# Patient Record
Sex: Male | Born: 1955 | Race: White | Hispanic: No | Smoking: Former smoker
Health system: Southern US, Community
[De-identification: ages and names within clinical notes are randomized; demographics above are authoritative.]

## PROBLEM LIST (undated history)

## (undated) DIAGNOSIS — K219 Gastro-esophageal reflux disease without esophagitis: Secondary | ICD-10-CM

## (undated) DIAGNOSIS — A630 Anogenital (venereal) warts: Secondary | ICD-10-CM

## (undated) DIAGNOSIS — N189 Chronic kidney disease, unspecified: Secondary | ICD-10-CM

## (undated) DIAGNOSIS — M1990 Unspecified osteoarthritis, unspecified site: Secondary | ICD-10-CM

## (undated) DIAGNOSIS — R011 Cardiac murmur, unspecified: Secondary | ICD-10-CM

## (undated) HISTORY — DX: Cardiac murmur, unspecified: R01.1

## (undated) HISTORY — DX: Gastro-esophageal reflux disease without esophagitis: K21.9

## (undated) HISTORY — DX: Chronic kidney disease, unspecified: N18.9

## (undated) HISTORY — DX: Unspecified osteoarthritis, unspecified site: M19.90

## (undated) HISTORY — DX: Anogenital (venereal) warts: A63.0

## (undated) HISTORY — PX: HERNIA REPAIR: SHX51

---

## 2003-05-12 ENCOUNTER — Encounter: Payer: Self-pay | Admitting: Family Medicine

## 2004-09-15 ENCOUNTER — Ambulatory Visit: Payer: Self-pay | Admitting: Psychology

## 2004-09-29 ENCOUNTER — Ambulatory Visit: Payer: Self-pay | Admitting: Psychology

## 2004-10-03 ENCOUNTER — Ambulatory Visit: Payer: Self-pay | Admitting: Psychology

## 2004-10-15 ENCOUNTER — Ambulatory Visit: Payer: Self-pay | Admitting: Psychology

## 2004-10-27 ENCOUNTER — Ambulatory Visit: Payer: Self-pay | Admitting: Psychology

## 2004-11-10 ENCOUNTER — Ambulatory Visit: Payer: Self-pay | Admitting: Psychology

## 2004-11-24 ENCOUNTER — Ambulatory Visit: Payer: Self-pay | Admitting: Psychology

## 2004-12-22 ENCOUNTER — Ambulatory Visit: Payer: Self-pay | Admitting: Psychology

## 2005-01-05 ENCOUNTER — Ambulatory Visit: Payer: Self-pay | Admitting: Psychology

## 2005-01-19 ENCOUNTER — Ambulatory Visit: Payer: Self-pay | Admitting: Psychology

## 2005-02-18 ENCOUNTER — Ambulatory Visit: Payer: Self-pay | Admitting: Psychology

## 2005-03-11 ENCOUNTER — Ambulatory Visit: Payer: Self-pay | Admitting: Psychology

## 2005-03-30 ENCOUNTER — Ambulatory Visit: Payer: Self-pay | Admitting: Psychology

## 2005-04-27 HISTORY — PX: KNEE SURGERY: SHX244

## 2008-12-06 ENCOUNTER — Ambulatory Visit: Payer: Self-pay | Admitting: Family Medicine

## 2008-12-07 LAB — CONVERTED CEMR LAB
ALT: 23 units/L (ref 0–53)
AST: 21 units/L (ref 0–37)
BUN: 23 mg/dL (ref 6–23)
Basophils Absolute: 0 10*3/uL (ref 0.0–0.1)
Bilirubin, Direct: 0.1 mg/dL (ref 0.0–0.3)
Calcium: 9.5 mg/dL (ref 8.4–10.5)
Cholesterol: 191 mg/dL (ref 0–200)
Creatinine, Ser: 0.9 mg/dL (ref 0.4–1.5)
Eosinophils Relative: 0.7 % (ref 0.0–5.0)
GFR calc non Af Amer: 93.73 mL/min (ref >60)
HDL: 42.3 mg/dL (ref >39.00)
LDL Cholesterol: 133 mg/dL — ABNORMAL HIGH (ref 0–99)
Monocytes Relative: 9.8 % (ref 3.0–12.0)
Neutrophils Relative %: 53.7 % (ref 43.0–77.0)
PSA: 0.41 ng/mL (ref 0.10–4.00)
Platelets: 153 10*3/uL (ref 150.0–400.0)
Total Bilirubin: 0.9 mg/dL (ref 0.3–1.2)
Triglycerides: 81 mg/dL (ref 0.0–149.0)
VLDL: 16.2 mg/dL (ref 0.0–40.0)
WBC: 5.6 10*3/uL (ref 4.5–10.5)

## 2009-01-01 ENCOUNTER — Ambulatory Visit: Payer: Self-pay | Admitting: Gastroenterology

## 2009-01-14 ENCOUNTER — Ambulatory Visit: Payer: Self-pay | Admitting: Gastroenterology

## 2009-01-23 ENCOUNTER — Telehealth: Payer: Self-pay | Admitting: Family Medicine

## 2009-01-23 DIAGNOSIS — N509 Disorder of male genital organs, unspecified: Secondary | ICD-10-CM | POA: Insufficient documentation

## 2009-02-12 ENCOUNTER — Encounter: Payer: Self-pay | Admitting: Family Medicine

## 2009-04-27 HISTORY — PX: MOUTH SURGERY: SHX715

## 2013-05-16 ENCOUNTER — Telehealth: Payer: Self-pay | Admitting: Family Medicine

## 2013-05-16 NOTE — Telephone Encounter (Signed)
Pt was last seen in 2010. Pt would like to re-est. Can I sch?

## 2013-05-17 NOTE — Telephone Encounter (Signed)
yes

## 2013-05-18 NOTE — Telephone Encounter (Signed)
PT HAS BEEN SCH

## 2013-05-18 NOTE — Telephone Encounter (Signed)
lmom for pt to call back

## 2013-05-24 ENCOUNTER — Encounter: Payer: Self-pay | Admitting: Family Medicine

## 2013-05-24 ENCOUNTER — Ambulatory Visit (INDEPENDENT_AMBULATORY_CARE_PROVIDER_SITE_OTHER): Payer: 59 | Admitting: Family Medicine

## 2013-05-24 VITALS — BP 130/70 | HR 88 | Temp 98.5°F | Ht 71.0 in | Wt 187.0 lb

## 2013-05-24 DIAGNOSIS — K219 Gastro-esophageal reflux disease without esophagitis: Secondary | ICD-10-CM | POA: Insufficient documentation

## 2013-05-24 DIAGNOSIS — Z0000 Encounter for general adult medical examination without abnormal findings: Secondary | ICD-10-CM

## 2013-05-24 LAB — CBC WITH DIFFERENTIAL/PLATELET
BASOS PCT: 0.5 % (ref 0.0–3.0)
Basophils Absolute: 0 10*3/uL (ref 0.0–0.1)
EOS ABS: 0 10*3/uL (ref 0.0–0.7)
Eosinophils Relative: 0.6 % (ref 0.0–5.0)
HCT: 41.7 % (ref 39.0–52.0)
HEMOGLOBIN: 13.8 g/dL (ref 13.0–17.0)
Lymphocytes Relative: 31.3 % (ref 12.0–46.0)
Lymphs Abs: 1.8 10*3/uL (ref 0.7–4.0)
MCHC: 33.2 g/dL (ref 30.0–36.0)
MCV: 94.4 fl (ref 78.0–100.0)
MONO ABS: 0.5 10*3/uL (ref 0.1–1.0)
Monocytes Relative: 8 % (ref 3.0–12.0)
NEUTROS ABS: 3.5 10*3/uL (ref 1.4–7.7)
NEUTROS PCT: 59.6 % (ref 43.0–77.0)
Platelets: 186 10*3/uL (ref 150.0–400.0)
RBC: 4.42 Mil/uL (ref 4.22–5.81)
RDW: 12.8 % (ref 11.5–14.6)
WBC: 5.9 10*3/uL (ref 4.5–10.5)

## 2013-05-24 LAB — HEPATIC FUNCTION PANEL
ALBUMIN: 4.2 g/dL (ref 3.5–5.2)
ALT: 28 U/L (ref 0–53)
AST: 21 U/L (ref 0–37)
Alkaline Phosphatase: 51 U/L (ref 39–117)
Bilirubin, Direct: 0 mg/dL (ref 0.0–0.3)
TOTAL PROTEIN: 6.6 g/dL (ref 6.0–8.3)
Total Bilirubin: 0.6 mg/dL (ref 0.3–1.2)

## 2013-05-24 LAB — BASIC METABOLIC PANEL
BUN: 18 mg/dL (ref 6–23)
CALCIUM: 9.5 mg/dL (ref 8.4–10.5)
CO2: 29 mEq/L (ref 19–32)
Chloride: 104 mEq/L (ref 96–112)
Creatinine, Ser: 0.9 mg/dL (ref 0.4–1.5)
GFR: 91.04 mL/min (ref >60.00)
GLUCOSE: 90 mg/dL (ref 70–99)
Potassium: 4 mEq/L (ref 3.5–5.1)
SODIUM: 139 meq/L (ref 135–145)

## 2013-05-24 LAB — LIPID PANEL
CHOLESTEROL: 224 mg/dL — AB (ref 0–200)
HDL: 45 mg/dL (ref >39.00)
Total CHOL/HDL Ratio: 5
Triglycerides: 125 mg/dL (ref 0.0–149.0)
VLDL: 25 mg/dL (ref 0.0–40.0)

## 2013-05-24 LAB — TSH: TSH: 2.51 u[IU]/mL (ref 0.35–5.50)

## 2013-05-24 LAB — PSA: PSA: 0.38 ng/mL (ref 0.10–4.00)

## 2013-05-24 NOTE — Progress Notes (Signed)
Pre visit review using our clinic review tool, if applicable. No additional management support is needed unless otherwise documented below in the visit note. 

## 2013-05-24 NOTE — Progress Notes (Signed)
   Subjective:    Patient ID: Jose Tyler, male    DOB: 01-17-1956, 58 y.o.   MRN: [id number]  HPI Patient here to establish care and for complete physical. Generally very healthy. He takes no regular medications. He states he had some type of" nephritis"at age 73 with no sequelae. He states he has past history of general warts. He had some recent GERD symptoms currently not taking in medications for that. He's had previous tonsillectomy. He had arthroscopic knee surgery 2007.  Patient is single. He underwent divorce several years ago. Has used Motrin remotely in the past. Occasional alcohol use. Currently not exercising regularly. Had colonoscopy age 57. Tetanus up-to-date. Declined flu vaccine.  Past Medical History  Diagnosis Date  . GERD (gastroesophageal reflux disease)   . Genital warts   . Heart murmur     Childhood  . Chronic kidney disease     age 72   Past Surgical History  Procedure Laterality Date  . Appendectomy    . Knee surgery Right 2007  . Mouth surgery  2011    reports that he has quit smoking. He does not have any smokeless tobacco history on file. He reports that he drinks alcohol. He reports that he does not use illicit drugs. family history includes Arthritis in his mother; Cancer in his paternal grandfather; Depression in his mother and sister; Hyperlipidemia in his father; Hypertension in his mother and paternal grandmother. No Known Allergies    Review of Systems  Constitutional: Negative for fever, activity change, appetite change and fatigue.  HENT: Negative for congestion, ear pain and trouble swallowing.   Eyes: Negative for pain and visual disturbance.  Respiratory: Negative for cough, shortness of breath and wheezing.   Cardiovascular: Negative for chest pain and palpitations.  Gastrointestinal: Negative for nausea, vomiting, abdominal pain, diarrhea, constipation, blood in stool, abdominal distention and rectal pain.  Genitourinary: Negative for  dysuria, hematuria and testicular pain.  Musculoskeletal: Negative for arthralgias and joint swelling.  Skin: Negative for rash.  Neurological: Negative for dizziness, syncope and headaches.  Hematological: Negative for adenopathy.  Psychiatric/Behavioral: Negative for confusion and dysphoric mood.       Objective:   Physical Exam  Constitutional: He is oriented to person, place, and time. He appears well-developed and well-nourished. No distress.  HENT:  Head: Normocephalic and atraumatic.  Right Ear: External ear normal.  Left Ear: External ear normal.  Mouth/Throat: Oropharynx is clear and moist.  Eyes: Conjunctivae and EOM are normal. Pupils are equal, round, and reactive to light.  Neck: Normal range of motion. Neck supple. No thyromegaly present.  Cardiovascular: Normal rate, regular rhythm and normal heart sounds.   No murmur heard. Pulmonary/Chest: No respiratory distress. He has no wheezes. He has no rales.  Abdominal: Soft. Bowel sounds are normal. He exhibits no distension and no mass. There is no tenderness. There is no rebound and no guarding.  Genitourinary: Rectum normal and prostate normal.  Musculoskeletal: He exhibits no edema.  Lymphadenopathy:    He has no cervical adenopathy.  Neurological: He is alert and oriented to person, place, and time. He displays normal reflexes. No cranial nerve deficit.  Skin: No rash noted.  Psychiatric: He has a normal mood and affect.          Assessment & Plan:  Complete physical. Labs obtained.  Discussed GERD management. Try over-the-counter Zantac or Pepcid for symptomatic treatment. Establish more consistent exercise. Patient will confirm date of last colonoscopy

## 2013-05-24 NOTE — Patient Instructions (Signed)
Gastroesophageal Reflux Disease, Adult  Gastroesophageal reflux disease (GERD) happens when acid from your stomach flows up into the esophagus. When acid comes in contact with the esophagus, the acid causes soreness (inflammation) in the esophagus. Over time, GERD may create small holes (ulcers) in the lining of the esophagus.  CAUSES   · Increased body weight. This puts pressure on the stomach, making acid rise from the stomach into the esophagus.  · Smoking. This increases acid production in the stomach.  · Drinking alcohol. This causes decreased pressure in the lower esophageal sphincter (valve or ring of muscle between the esophagus and stomach), allowing acid from the stomach into the esophagus.  · Late evening meals and a full stomach. This increases pressure and acid production in the stomach.  · A malformed lower esophageal sphincter.  Sometimes, no cause is found.  SYMPTOMS   · Burning pain in the lower part of the mid-chest behind the breastbone and in the mid-stomach area. This may occur twice a week or more often.  · Trouble swallowing.  · Sore throat.  · Dry cough.  · Asthma-like symptoms including chest tightness, shortness of breath, or wheezing.  DIAGNOSIS   Your caregiver may be able to diagnose GERD based on your symptoms. In some cases, X-rays and other tests may be done to check for complications or to check the condition of your stomach and esophagus.  TREATMENT   Your caregiver may recommend over-the-counter or prescription medicines to help decrease acid production. Ask your caregiver before starting or adding any new medicines.   HOME CARE INSTRUCTIONS   · Change the factors that you can control. Ask your caregiver for guidance concerning weight loss, quitting smoking, and alcohol consumption.  · Avoid foods and drinks that make your symptoms worse, such as:  · Caffeine or alcoholic drinks.  · Chocolate.  · Peppermint or mint flavorings.  · Garlic and onions.  · Spicy foods.  · Citrus fruits,  such as oranges, lemons, or limes.  · Tomato-based foods such as sauce, chili, salsa, and pizza.  · Fried and fatty foods.  · Avoid lying down for the 3 hours prior to your bedtime or prior to taking a nap.  · Eat small, frequent meals instead of large meals.  · Wear loose-fitting clothing. Do not wear anything tight around your waist that causes pressure on your stomach.  · Raise the head of your bed 6 to 8 inches with wood blocks to help you sleep. Extra pillows will not help.  · Only take over-the-counter or prescription medicines for pain, discomfort, or fever as directed by your caregiver.  · Do not take aspirin, ibuprofen, or other nonsteroidal anti-inflammatory drugs (NSAIDs).  SEEK IMMEDIATE MEDICAL CARE IF:   · You have pain in your arms, neck, jaw, teeth, or back.  · Your pain increases or changes in intensity or duration.  · You develop nausea, vomiting, or sweating (diaphoresis).  · You develop shortness of breath, or you faint.  · Your vomit is green, yellow, black, or looks like coffee grounds or blood.  · Your stool is red, bloody, or black.  These symptoms could be signs of other problems, such as heart disease, gastric bleeding, or esophageal bleeding.  MAKE SURE YOU:   · Understand these instructions.  · Will watch your condition.  · Will get help right away if you are not doing well or get worse.  Document Released: 01/21/2005 Document Revised: 07/06/2011 Document Reviewed: 10/31/2010  [employer]® Patient   Information ©2014 [employer].

## 2013-05-25 LAB — LDL CHOLESTEROL, DIRECT: LDL DIRECT: 152.4 mg/dL

## 2013-10-18 ENCOUNTER — Encounter (HOSPITAL_COMMUNITY): Payer: Self-pay | Admitting: Emergency Medicine

## 2013-10-18 DIAGNOSIS — N189 Chronic kidney disease, unspecified: Secondary | ICD-10-CM | POA: Insufficient documentation

## 2013-10-18 DIAGNOSIS — Y929 Unspecified place or not applicable: Secondary | ICD-10-CM | POA: Insufficient documentation

## 2013-10-18 DIAGNOSIS — T17208A Unspecified foreign body in pharynx causing other injury, initial encounter: Secondary | ICD-10-CM | POA: Insufficient documentation

## 2013-10-18 DIAGNOSIS — Y9389 Activity, other specified: Secondary | ICD-10-CM | POA: Insufficient documentation

## 2013-10-18 DIAGNOSIS — IMO0002 Reserved for concepts with insufficient information to code with codable children: Secondary | ICD-10-CM | POA: Insufficient documentation

## 2013-10-18 DIAGNOSIS — R011 Cardiac murmur, unspecified: Secondary | ICD-10-CM | POA: Insufficient documentation

## 2013-10-18 DIAGNOSIS — Z87891 Personal history of nicotine dependence: Secondary | ICD-10-CM | POA: Insufficient documentation

## 2013-10-18 DIAGNOSIS — Z8619 Personal history of other infectious and parasitic diseases: Secondary | ICD-10-CM | POA: Insufficient documentation

## 2013-10-18 DIAGNOSIS — Z8719 Personal history of other diseases of the digestive system: Secondary | ICD-10-CM | POA: Insufficient documentation

## 2013-10-18 DIAGNOSIS — Z9889 Other specified postprocedural states: Secondary | ICD-10-CM | POA: Insufficient documentation

## 2013-10-18 NOTE — ED Notes (Signed)
Pt. reports food stuck at throat while eating chicken this evening , airway intact/ respirations unlabored .

## 2013-10-19 ENCOUNTER — Emergency Department (HOSPITAL_COMMUNITY)
Admission: EM | Admit: 2013-10-19 | Discharge: 2013-10-19 | Disposition: A | Discharge status: Home / Self Care | Payer: 59 | Attending: Emergency Medicine | Admitting: Emergency Medicine

## 2013-10-19 DIAGNOSIS — T17298A Other foreign object in pharynx causing other injury, initial encounter: Secondary | ICD-10-CM

## 2013-10-19 MED ORDER — GI COCKTAIL ~~LOC~~
30.0000 mL | Freq: Once | ORAL | Status: AC
  Administered 2013-10-19: 30 mL via ORAL
  Filled 2013-10-19: qty 30

## 2013-10-19 NOTE — Discharge Instructions (Signed)
You will have some soreness to the back of your throat for the next few days.  Stick to a soft diet.  Salt water gargles will help with improved healing.  Return to the ER for worsening condition or new concerning symptoms.

## 2013-10-19 NOTE — ED Provider Notes (Signed)
CSN: [id number]     Arrival date & time 10/18/13  2214 History   First MD Initiated Contact with Patient 10/19/13 0253     Chief Complaint  Patient presents with  . Sore Throat     (Consider location/radiation/quality/duration/timing/severity/associated sxs/prior Treatment) HPI 58 year old male presents to emergency room with complaint of foreign object lodged in the back of his throat.  Patient reports that he was eating chicken when he suddenly felt a sharp sticking sensation.  Since that time, he has had difficulty swallowing.  Patient reports that he can see the object.  He is unsure if it is a chicken bone or a feather. Past Medical History  Diagnosis Date  . GERD (gastroesophageal reflux disease)   . Genital warts   . Heart murmur     Childhood  . Chronic kidney disease     age 72   Past Surgical History  Procedure Laterality Date  . Appendectomy    . Knee surgery Right 2007  . Mouth surgery  2011   Family History  Problem Relation Age of Onset  . Depression Mother   . Arthritis Mother   . Hypertension Mother   . Hyperlipidemia Father   . Hypertension Paternal Grandmother   . Depression Sister   . Cancer Paternal Grandfather     esophageal   History  Substance Use Topics  . Smoking status: Former Research scientist (life sciences)  . Smokeless tobacco: Not on file  . Alcohol Use: Yes    Review of Systems  See History of Present Illness; otherwise all other systems are reviewed and negative   Allergies  Review of patient's allergies indicates no known allergies.  Home Medications   Prior to Admission medications   Not on File   BP 135/87  Pulse 74  Temp(Src) 98.1 F (36.7 C) (Oral)  Resp 20  Ht 6' (1.829 m)  Wt 174 lb (78.926 kg)  BMI 23.59 kg/m2  SpO2 100% Physical Exam  Nursing note and vitals reviewed. Constitutional: He is oriented to person, place, and time. He appears well-developed and well-nourished. He appears distressed.  HENT:  Head: Normocephalic and  atraumatic.  Right Ear: External ear normal.  Left Ear: External ear normal.  Nose: Nose normal.  Mouth/Throat: Oropharynx is clear and moist.   Foreign object noted behind right tonsillar pillar in posterior throat  Cardiovascular: Normal rate, regular rhythm, normal heart sounds and intact distal pulses.  Exam reveals no gallop and no friction rub.   No murmur heard. Pulmonary/Chest: Effort normal and breath sounds normal. No respiratory distress. He has no wheezes. He has no rales. He exhibits no tenderness.  Musculoskeletal: Normal range of motion. He exhibits no edema and no tenderness.  Neurological: He is alert and oriented to person, place, and time. He exhibits normal muscle tone. Coordination normal.  Skin: Skin is warm and dry. No rash noted. He is not diaphoretic. No erythema. No pallor.  Psychiatric: He has a normal mood and affect. His behavior is normal. Judgment and thought content normal.    ED Course  FOREIGN BODY REMOVAL Date/Time: 10/19/2013 3:15 AM Performed by: Kalman Drape Authorized by: Kalman Drape Consent: Verbal consent obtained. Consent given by: patient Patient understanding: patient states understanding of the procedure being performed Required items: required blood products, implants, devices, and special equipment available Patient identity confirmed: verbally with patient Body area: throat Local anesthetic: topical anesthetic Patient sedated: no Patient restrained: no Patient cooperative: no Localization method: visualized Removal mechanism: alligator forceps Complexity:  simple 1 objects recovered. Objects recovered: 1 metal bristle Post-procedure assessment: foreign body removed Patient tolerance: Patient tolerated the procedure well with no immediate complications.   (including critical care time) Labs Review Labs Reviewed - No data to display  Imaging Review No results found.   EKG Interpretation None      MDM   Final  diagnoses:  Oth foreign object in pharynx causing oth injury, init   58 year old male with foreign object in throat.  Object appears to be the bristle from a grill cleaning brush.  Object was removed.  Patient had immediate relief.  Patient instructed to do saltwater gargles and stick to a soft diet until feeling better.    Kalman Drape, MD 10/19/13 [phone]

## 2013-10-19 NOTE — ED Notes (Signed)
MD at bedside. 

## 2014-04-14 ENCOUNTER — Encounter (HOSPITAL_COMMUNITY): Payer: Self-pay | Admitting: Emergency Medicine

## 2014-04-14 ENCOUNTER — Emergency Department (INDEPENDENT_AMBULATORY_CARE_PROVIDER_SITE_OTHER)
Admission: EM | Admit: 2014-04-14 | Discharge: 2014-04-14 | Disposition: A | Discharge status: Home / Self Care | Payer: PRIVATE HEALTH INSURANCE | Source: Home / Self Care | Attending: Emergency Medicine | Admitting: Emergency Medicine

## 2014-04-14 DIAGNOSIS — J069 Acute upper respiratory infection, unspecified: Secondary | ICD-10-CM

## 2014-04-14 LAB — POCT RAPID STREP A: Streptococcus, Group A Screen (Direct): NEGATIVE

## 2014-04-14 MED ORDER — IPRATROPIUM BROMIDE 0.06 % NA SOLN: 2.0000 | Freq: Four times a day (QID) | NASAL | Status: DC

## 2014-04-14 MED ORDER — BENZONATATE 100 MG PO CAPS: 100.0000 mg | ORAL_CAPSULE | Freq: Three times a day (TID) | ORAL | Status: DC | PRN

## 2014-04-14 NOTE — ED Provider Notes (Signed)
CSN: [id number]     Arrival date & time 04/14/14  2130 History   First MD Initiated Contact with Patient 04/14/14 [phone]     Chief Complaint  Patient presents with  . URI   (Consider location/radiation/quality/duration/timing/severity/associated sxs/prior Treatment) Patient is a 58 y.o. male presenting with URI. The history is provided by the patient.  URI Presenting symptoms: congestion, cough, fatigue, rhinorrhea and sore throat   Presenting symptoms: no ear pain, no facial pain and no fever   Severity:  Moderate Onset quality:  Gradual Duration:  6 days Timing:  Constant Progression:  Unchanged Chronicity:  New Associated symptoms: myalgias   Associated symptoms: no arthralgias, no headaches, no neck pain, no sinus pain, no sneezing, no swollen glands and no wheezing     Past Medical History  Diagnosis Date  . GERD (gastroesophageal reflux disease)   . Genital warts   . Heart murmur     Childhood  . Chronic kidney disease     age 61   Past Surgical History  Procedure Laterality Date  . Appendectomy    . Knee surgery Right 2007  . Mouth surgery  2011   Family History  Problem Relation Age of Onset  . Depression Mother   . Arthritis Mother   . Hypertension Mother   . Hyperlipidemia Father   . Hypertension Paternal Grandmother   . Depression Sister   . Cancer Paternal Grandfather     esophageal   History  Substance Use Topics  . Smoking status: Former Research scientist (life sciences)  . Smokeless tobacco: Not on file  . Alcohol Use: Yes    Review of Systems  Constitutional: Positive for fatigue. Negative for fever.  HENT: Positive for congestion, rhinorrhea and sore throat. Negative for ear pain and sneezing.   Respiratory: Positive for cough. Negative for wheezing.   Musculoskeletal: Positive for myalgias. Negative for arthralgias and neck pain.  Neurological: Negative for headaches.  All other systems reviewed and are negative.   Allergies  Review of patient's allergies  indicates no known allergies.  Home Medications   Prior to Admission medications   Medication Sig Start Date End Date Taking? Authorizing Provider  benzonatate (TESSALON) 100 MG capsule Take 1 capsule (100 mg total) by mouth 3 (three) times daily as needed for cough. 04/14/14   Audelia Hives Albaraa Swingle, PA  ipratropium (ATROVENT) 0.06 % nasal spray Place 2 sprays into both nostrils 4 (four) times daily. 04/14/14   Annett Gula H Mikai Meints, PA   BP 128/78 mmHg  Pulse 84  Temp(Src) 98.5 F (36.9 C) (Oral)  Resp 16  SpO2 99% Physical Exam  Constitutional: He is oriented to person, place, and time. He appears well-developed and well-nourished. No distress.  HENT:  Head: Normocephalic and atraumatic.  Right Ear: Hearing, tympanic membrane, external ear and ear canal normal.  Left Ear: Hearing, tympanic membrane, external ear and ear canal normal.  Nose: Nose normal.  Mouth/Throat: Uvula is midline and mucous membranes are normal. No oral lesions. No trismus in the jaw. No uvula swelling. Posterior oropharyngeal erythema present. No oropharyngeal exudate, posterior oropharyngeal edema or tonsillar abscesses.  Eyes: Conjunctivae are normal.  Neck: Normal range of motion. Neck supple.  Cardiovascular: Normal rate, regular rhythm and normal heart sounds.   Pulmonary/Chest: Effort normal and breath sounds normal.  Musculoskeletal: Normal range of motion.  Lymphadenopathy:    He has no cervical adenopathy.  Neurological: He is alert and oriented to person, place, and time.  Skin: Skin is warm and  dry.  Psychiatric: He has a normal mood and affect. His behavior is normal.  Nursing note and vitals reviewed.   ED Course  Procedures (including critical care time) Labs Review Labs Reviewed  POCT RAPID STREP A ([facility] CARE ONLY)    Imaging Review No results found.   MDM   1. URI (upper respiratory infection)    Rapid strep negative Exam benign Symptomatic care at home Atrovent nasal  spray for congestion Tessalon for cough PCP follow up if no improvement.   Lutricia Feil, [place] 04/14/14 1013

## 2014-04-14 NOTE — Discharge Instructions (Signed)
Rapid strep test was negative. Specimen will be held for culture and if result indicates the need for additional treatment, you will be notified by phone. Please stay well hydrated, tylenol or ibuprofen as directed on packaging for body aches. Tessalon for cough. Atrovent nasal spray for nasal congestion. Follow up with PCP if return of fever or symptoms do not begin to improve over the next 4-5 days.  Upper Respiratory Infection, Adult An upper respiratory infection (URI) is also sometimes known as the common cold. The upper respiratory tract includes the nose, sinuses, throat, trachea, and bronchi. Bronchi are the airways leading to the lungs. Most people improve within 1 week, but symptoms can last up to 2 weeks. A residual cough may last even longer.  CAUSES Many different viruses can infect the tissues lining the upper respiratory tract. The tissues become irritated and inflamed and often become very moist. Mucus production is also common. A cold is contagious. You can easily spread the virus to others by oral contact. This includes kissing, sharing a glass, coughing, or sneezing. Touching your mouth or nose and then touching a surface, which is then touched by another person, can also spread the virus. SYMPTOMS  Symptoms typically develop 1 to 3 days after you come in contact with a cold virus. Symptoms vary from person to person. They may include:  Runny nose.  Sneezing.  Nasal congestion.  Sinus irritation.  Sore throat.  Loss of voice (laryngitis).  Cough.  Fatigue.  Muscle aches.  Loss of appetite.  Headache.  Low-grade fever. DIAGNOSIS  You might diagnose your own cold based on familiar symptoms, since most people get a cold 2 to 3 times a year. Your caregiver can confirm this based on your exam. Most importantly, your caregiver can check that your symptoms are not due to another disease such as strep throat, sinusitis, pneumonia, asthma, or epiglottitis. Blood tests,  throat tests, and X-rays are not necessary to diagnose a common cold, but they may sometimes be helpful in excluding other more serious diseases. Your caregiver will decide if any further tests are required. RISKS AND COMPLICATIONS  You may be at risk for a more severe case of the common cold if you smoke cigarettes, have chronic heart disease (such as heart failure) or lung disease (such as asthma), or if you have a weakened immune system. The very young and very old are also at risk for more serious infections. Bacterial sinusitis, middle ear infections, and bacterial pneumonia can complicate the common cold. The common cold can worsen asthma and chronic obstructive pulmonary disease (COPD). Sometimes, these complications can require emergency medical care and may be life-threatening. PREVENTION  The best way to protect against getting a cold is to practice good hygiene. Avoid oral or hand contact with people with cold symptoms. Wash your hands often if contact occurs. There is no clear evidence that vitamin C, vitamin E, echinacea, or exercise reduces the chance of developing a cold. However, it is always recommended to get plenty of rest and practice good nutrition. TREATMENT  Treatment is directed at relieving symptoms. There is no cure. Antibiotics are not effective, because the infection is caused by a virus, not by bacteria. Treatment may include:  Increased fluid intake. Sports drinks offer valuable electrolytes, sugars, and fluids.  Breathing heated mist or steam (vaporizer or shower).  Eating chicken soup or other clear broths, and maintaining good nutrition.  Getting plenty of rest.  Using gargles or lozenges for comfort.  Controlling fevers  with ibuprofen or acetaminophen as directed by your caregiver.  Increasing usage of your inhaler if you have asthma. Zinc gel and zinc lozenges, taken in the first 24 hours of the common cold, can shorten the duration and lessen the severity of  symptoms. Pain medicines may help with fever, muscle aches, and throat pain. A variety of non-prescription medicines are available to treat congestion and runny nose. Your caregiver can make recommendations and may suggest nasal or lung inhalers for other symptoms.  HOME CARE INSTRUCTIONS   Only take over-the-counter or prescription medicines for pain, discomfort, or fever as directed by your caregiver.  Use a warm mist humidifier or inhale steam from a shower to increase air moisture. This may keep secretions moist and make it easier to breathe.  Drink enough water and fluids to keep your urine clear or pale yellow.  Rest as needed.  Return to work when your temperature has returned to normal or as your caregiver advises. You may need to stay home longer to avoid infecting others. You can also use a face mask and careful hand washing to prevent spread of the virus. SEEK MEDICAL CARE IF:   After the first few days, you feel you are getting worse rather than better.  You need your caregiver's advice about medicines to control symptoms.  You develop chills, worsening shortness of breath, or brown or red sputum. These may be signs of pneumonia.  You develop yellow or brown nasal discharge or pain in the face, especially when you bend forward. These may be signs of sinusitis.  You develop a fever, swollen neck glands, pain with swallowing, or white areas in the back of your throat. These may be signs of strep throat. SEEK IMMEDIATE MEDICAL CARE IF:   You have a fever.  You develop severe or persistent headache, ear pain, sinus pain, or chest pain.  You develop wheezing, a prolonged cough, cough up blood, or have a change in your usual mucus (if you have chronic lung disease).  You develop sore muscles or a stiff neck. Document Released: 10/07/2000 Document Revised: 07/06/2011 Document Reviewed: 07/19/2013 [facility] Patient Information 2015 [place], [place]. This information is not intended  to replace advice given to you by your health care provider. Make sure you discuss any questions you have with your health care provider.

## 2014-04-14 NOTE — ED Notes (Signed)
C/o cold sx onset 1 week Sx include: fevers, ST, BA, congestion Denies SOB, wheezing Taking OTC cold meds w/no relief Alert, no signs of acute distress.

## 2014-04-16 LAB — CULTURE, GROUP A STREP

## 2015-10-31 ENCOUNTER — Encounter: Payer: Self-pay | Admitting: Family Medicine

## 2015-10-31 ENCOUNTER — Ambulatory Visit (INDEPENDENT_AMBULATORY_CARE_PROVIDER_SITE_OTHER): Payer: PRIVATE HEALTH INSURANCE | Admitting: Family Medicine

## 2015-10-31 VITALS — BP 130/84 | HR 78 | Temp 98.4°F | Ht 72.0 in | Wt 180.0 lb

## 2015-10-31 DIAGNOSIS — H65192 Other acute nonsuppurative otitis media, left ear: Secondary | ICD-10-CM | POA: Diagnosis not present

## 2015-10-31 DIAGNOSIS — M25562 Pain in left knee: Secondary | ICD-10-CM

## 2015-10-31 DIAGNOSIS — J029 Acute pharyngitis, unspecified: Secondary | ICD-10-CM | POA: Diagnosis not present

## 2015-10-31 DIAGNOSIS — M25561 Pain in right knee: Secondary | ICD-10-CM | POA: Diagnosis not present

## 2015-10-31 DIAGNOSIS — H6980 Other specified disorders of Eustachian tube, unspecified ear: Secondary | ICD-10-CM

## 2015-10-31 MED ORDER — AMOXICILLIN-POT CLAVULANATE 875-125 MG PO TABS: 1.0000 | ORAL_TABLET | Freq: Two times a day (BID) | ORAL | Status: DC

## 2015-10-31 NOTE — Progress Notes (Signed)
Pre visit review using our clinic review tool, if applicable. No additional management support is needed unless otherwise documented below in the visit note. 

## 2015-10-31 NOTE — Patient Instructions (Addendum)
Please follow up if symptoms have not responded to treatment. Also, schedule an appointment with your provider for a physical and blood work soon. Please use zyrtec and flonase for symptoms at this time. Also, you may use ibuprofen 600 mg every 6 hours when joint pain occurs. If symptoms reoccur, please follow up with your provider.  Otitis Media, Adult Otitis media is redness, soreness, and inflammation of the middle ear. Otitis media may be caused by allergies or, most commonly, by infection. Often it occurs as a complication of the common cold. SIGNS AND SYMPTOMS Symptoms of otitis media may include:  Earache.  Fever.  Ringing in your ear.  Headache.  Leakage of fluid from the ear. DIAGNOSIS To diagnose otitis media, your health care provider will examine your ear with an otoscope. This is an instrument that allows your health care provider to see into your ear in order to examine your eardrum. Your health care provider also will ask you questions about your symptoms. TREATMENT  Typically, otitis media resolves on its own within 3-5 days. Your health care provider may prescribe medicine to ease your symptoms of pain. If otitis media does not resolve within 5 days or is recurrent, your health care provider may prescribe antibiotic medicines if he or she suspects that a bacterial infection is the cause. HOME CARE INSTRUCTIONS   If you were prescribed an antibiotic medicine, finish it all even if you start to feel better.  Take medicines only as directed by your health care provider.  Keep all follow-up visits as directed by your health care provider. SEEK MEDICAL CARE IF:  You have otitis media only in one ear, or bleeding from your nose, or both.  You notice a lump on your neck.  You are not getting better in 3-5 days.  You feel worse instead of better. SEEK IMMEDIATE MEDICAL CARE IF:   You have pain that is not controlled with medicine.  You have swelling, redness, or  pain around your ear or stiffness in your neck.  You notice that part of your face is paralyzed.  You notice that the bone behind your ear (mastoid) is tender when you touch it. MAKE SURE YOU:   Understand these instructions.  Will watch your condition.  Will get help right away if you are not doing well or get worse.   This information is not intended to replace advice given to you by your health care provider. Make sure you discuss any questions you have with your health care provider.   Document Released: 01/17/2004 Document Revised: 05/04/2014 Document Reviewed: 11/08/2012 Elsevier Interactive Patient Education [employer].

## 2015-10-31 NOTE — Progress Notes (Signed)
Subjective:    Patient ID: Jose Tyler, male    DOB: 1955-06-09, 60 y.o.   MRN: [id number]  HPI  Jose Tyler is a 60 year old male who presents today with joint pain in knees that started approximately 2 months ago and reports that it "comes and goes". He reports that the pain is not present today but reports that he noticed this over a 6 week period that has since resolved. Pain is described as dull and rated as high as a 7 at times that was exacerbated with activity.  Treatment at home with advil 600 mg when needed that relieved the pain. Pain has not reoccurred over the past 2 weeks since resolved.  History of right knee pain that was evaluated by an orthopedist. Patient reports that this occurred 10 year ago and reports cartilage damage.  He denies fever, chills, sweats, joint swelling/erythema.  He also reports left ear pressure/pain and headache that is dull that has been occurring for one week.  Associated symptoms of congestion, and sore throat. He denies fever, chills, sweats, sinus pressure/pain, N/V/D, and watery eyes.  Denies sick contact exposure or recent antibiotic use. History of seasonal allergies and zyrtec has provided relief. No history of asthma or bronchitis.    Review of Systems  Constitutional: Negative for fever and chills.  HENT: Positive for congestion, ear pain and sore throat. Negative for sinus pressure.   Eyes: Positive for redness. Negative for photophobia and visual disturbance.       Watery eyes  Respiratory: Negative for cough and shortness of breath.   Cardiovascular: Negative for chest pain and palpitations.  Gastrointestinal: Negative for nausea, vomiting, abdominal pain and diarrhea.  Genitourinary: Negative for dysuria.  Musculoskeletal: Positive for arthralgias. Negative for myalgias.  Skin: Negative for rash.  Neurological: Positive for headaches. Negative for dizziness and light-headedness.   Past Medical History  Diagnosis Date  . GERD  (gastroesophageal reflux disease)   . Genital warts   . Heart murmur     Childhood  . Chronic kidney disease     age 30     Social History   Social History  . Marital Status: Divorced    Spouse Name: N/A  . Number of Children: N/A  . Years of Education: N/A   Occupational History  . Not on file.   Social History Main Topics  . Smoking status: Former Research scientist (life sciences)  . Smokeless tobacco: Not on file  . Alcohol Use: Yes  . Drug Use: No  . Sexual Activity: Not on file   Other Topics Concern  . Not on file   Social History Narrative    Past Surgical History  Procedure Laterality Date  . Appendectomy    . Knee surgery Right 2007  . Mouth surgery  2011    Family History  Problem Relation Age of Onset  . Depression Mother   . Arthritis Mother   . Hypertension Mother   . Hyperlipidemia Father   . Hypertension Paternal Grandmother   . Depression Sister   . Cancer Paternal Grandfather     esophageal    No Known Allergies  No current outpatient prescriptions on file prior to visit.   No current facility-administered medications on file prior to visit.    BP 130/84 mmHg  Pulse 78  Temp(Src) 98.4 F (36.9 C) (Oral)  Ht 6' (1.829 m)  Wt 180 lb (81.647 kg)  BMI 24.41 kg/m2       Objective:   Physical Exam  Constitutional: He is oriented to person, place, and time. He appears well-developed and well-nourished.  HENT:  Right Ear: Tympanic membrane normal.  Left Ear: Tympanic membrane is injected and erythematous.  Mouth/Throat: Mucous membranes are normal. No oropharyngeal exudate or posterior oropharyngeal erythema.  Eyes: Pupils are equal, round, and reactive to light. No scleral icterus.  Very mild injected sclera noted in left eye. Patient reports taking out his contact lens that has provided benefit. No drainage, visual loss, or photophobia present.  Neck: Neck supple.  Cardiovascular: Normal rate and regular rhythm.   Pulmonary/Chest: Effort normal and  breath sounds normal. He has no wheezes. He has no rales.  Musculoskeletal:       Right knee: Normal.       Left knee: Normal.  Lymphadenopathy:    He has cervical adenopathy.  Neurological: He is alert and oriented to person, place, and time. No sensory deficit.  Visual fields grossly intact. Extraocular movements intact. Pupils reactive bilaterally. Smile symmetric, equal eyebrow raise, facial sensation intact  Hearing grossly intact Motor: 5/5 bilaterally with normal tone and bulk Ambulates with a coordinated gait   Skin: Skin is warm and dry. No rash noted.  Psychiatric: He has a normal mood and affect. His behavior is normal. Judgment and thought content normal.       Assessment & Plan:  1. Acute nonsuppurative otitis media of left ear Exam and history support treatment for OM. Advised patient on supportive measures:  Get rest, drink plenty of fluids, and use tylenol or ibuprofen as needed for pain. Follow up if fever >101, if symptoms worsen or if symptoms are not improved in 3 to 4  Days.  Continue use of zyrtec and add flonase to regimen for control of symptoms of allergic rhinitis.  - amoxicillin-clavulanate (AUGMENTIN) 875-125 MG tablet; Take 1 tablet by mouth 2 (two) times daily.  Dispense: 20 tablet; Refill: 0  2. Eustachian tube dysfunction, unspecified laterality   3. Sore throat Warm, salt water gargles advised in addition to above treatments that will assist with sore throat discomfort.  4. Arthralgia of both knees Trial of ibuprofen 600 mg every 6 hours as needed for knee pain. Advised patient to follow up with his PCP if symptoms persisted, worsened, or he develops new symptoms. Further discussed importance of reporting any knee swelling, redness, or fever if this occurs.  Advised patient to schedule a physical and lab work with his provider. Also advised him to follow up if symptoms of joint pain reoccur or symptoms of OM do not improve with treatment. Patient  voiced understanding and agreed with plan.  Delano Metz, FNP-C

## 2015-11-05 ENCOUNTER — Ambulatory Visit (INDEPENDENT_AMBULATORY_CARE_PROVIDER_SITE_OTHER): Payer: PRIVATE HEALTH INSURANCE | Admitting: Family Medicine

## 2015-11-05 ENCOUNTER — Telehealth: Payer: Self-pay | Admitting: Family Medicine

## 2015-11-05 ENCOUNTER — Encounter: Payer: Self-pay | Admitting: Family Medicine

## 2015-11-05 VITALS — BP 120/80 | HR 87 | Temp 98.4°F | Ht 72.0 in | Wt 178.0 lb

## 2015-11-05 DIAGNOSIS — R519 Headache, unspecified: Secondary | ICD-10-CM

## 2015-11-05 DIAGNOSIS — R51 Headache: Secondary | ICD-10-CM | POA: Diagnosis not present

## 2015-11-05 NOTE — Patient Instructions (Signed)
Follow up for any rash,fever, or new symptoms such as blurred vision.

## 2015-11-05 NOTE — Progress Notes (Signed)
   Subjective:    Patient ID: Jose Tyler, male    DOB: 03-27-1956, 60 y.o.   MRN: [id number]  HPI  Patient recently seen for possible ear infection and started on Augmentin. He states he has early morning headaches which are mostly bifrontal as noted some scalp tenderness mostly left parietal area. No skin rash. He's had some general fatigue which seems to improve as the day goes on. No fevers or chills. He's been taking some Zyrtec and Flonase as well. No cough. No visual changes. No purulent nasal secretions. No occipital pain. Does not describe any paroxysmal pain  Past Medical History  Diagnosis Date  . GERD (gastroesophageal reflux disease)   . Genital warts   . Heart murmur     Childhood  . Chronic kidney disease     age 59   Past Surgical History  Procedure Laterality Date  . Appendectomy    . Knee surgery Right 2007  . Mouth surgery  2011    reports that he has quit smoking. He does not have any smokeless tobacco history on file. He reports that he drinks alcohol. He reports that he does not use illicit drugs. family history includes Arthritis in his mother; Cancer in his paternal grandfather; Depression in his mother and sister; Hyperlipidemia in his father; Hypertension in his mother and paternal grandmother. No Known Allergies   Review of Systems  Constitutional: Negative for fever and chills.  HENT: Negative for facial swelling.   Eyes: Negative for redness and visual disturbance.  Respiratory: Negative for cough.   Cardiovascular: Negative for chest pain.  Skin: Negative for rash.  Neurological: Negative for dizziness.       Objective:   Physical Exam  Constitutional: He is oriented to person, place, and time. He appears well-developed and well-nourished.  HENT:  Right Ear: External ear normal.  Left Ear: External ear normal.  Mouth/Throat: Oropharynx is clear and moist.  Eyes: Pupils are equal, round, and reactive to light.  Neck: Neck supple.    Cardiovascular: Normal rate and regular rhythm.   Pulmonary/Chest: Effort normal and breath sounds normal. No respiratory distress. He has no wheezes. He has no rales.  Lymphadenopathy:    He has no cervical adenopathy.  Neurological: He is alert and oriented to person, place, and time. No cranial nerve deficit.  Skin: No rash noted.          Assessment & Plan:  Patient resents with early morning headaches along with sinus congestion and possible acute sinusitis involving frontal sinuses. He's describing some scalp tenderness parietal area. No temporal artery tenderness. No visual changes to suggest temporal arteritis. No rash to suggest shingles- though he is aware that pain sometimes precedes rash .  No evidence for acute supperative otitis media at this time. Recommend: -Finish out Augmentin. -Try Advil or Aleve as an anti-inflammatory -Follow-up promptly for any rash, fever, or other new symptom -Consider limited maxillofacial CT if symptoms persist - he has upcoming scheduled CPE.  Eulas Post MD [facility] at [facility]

## 2015-11-05 NOTE — Progress Notes (Signed)
Pre visit review using our clinic review tool, if applicable. No additional management support is needed unless otherwise documented below in the visit note. 

## 2015-11-05 NOTE — Telephone Encounter (Signed)
[facility] - Client [facility]  Patient Name: Jose Tyler  DOB: May 11, 1955    Initial Comment Saw dr. Raquel Sarna for sinus infection, put on antibiotic but is not working. he has pain on scalp, pressure on eyeball, sore throat all on the left side.    Nurse Assessment  Nurse: Wynetta Emery, RN, Baker Janus Date/Time Eilene Ghazi Time): 11/05/2015 9:13:35 AM  Confirm and document reason for call. If symptomatic, describe symptoms. You must click the next button to save text entered. ---Tully was in office on Thursday and dx with otitis media and given antibiotic; only on one side of head; eye hurts top of scalp hurts left side sore throat not getting better.  Has the patient traveled out of the country within the last 30 days? ---No  Does the patient have any new or worsening symptoms? ---Yes  Will a triage be completed? ---Yes  Related visit to physician within the last 2 weeks? ---No  Does the PT have any chronic conditions? (i.e. diabetes, asthma, etc.) ---No  Is this a behavioral health or substance abuse call? ---No     Guidelines    Guideline Title Affirmed Question Affirmed Notes  Ear - Otitis Media Follow-up Call [1] Taking antibiotic > 72 hours (3 days) and [2] pain persists or recurs    Final Disposition User   See Physician within [address] Hours [place], RN, Baker Janus    Comments  NOTE: 11/05/2015 Tuesday 315pm arrival 330pm appt time with Dr. Elease Hashimoto c/o ear better one half side of head to neck hurts eye pain and pressure.   Referrals  REFERRED TO PCP OFFICE   Disagree/Comply: Comply

## 2015-11-11 ENCOUNTER — Encounter: Payer: PRIVATE HEALTH INSURANCE | Admitting: Family Medicine

## 2015-11-11 ENCOUNTER — Other Ambulatory Visit (INDEPENDENT_AMBULATORY_CARE_PROVIDER_SITE_OTHER): Payer: PRIVATE HEALTH INSURANCE

## 2015-11-11 DIAGNOSIS — Z0000 Encounter for general adult medical examination without abnormal findings: Secondary | ICD-10-CM

## 2015-11-11 DIAGNOSIS — R7989 Other specified abnormal findings of blood chemistry: Secondary | ICD-10-CM

## 2015-11-11 LAB — HEPATIC FUNCTION PANEL
ALBUMIN: 4.2 g/dL (ref 3.5–5.2)
ALK PHOS: 44 U/L (ref 39–117)
ALT: 32 U/L (ref 0–53)
AST: 25 U/L (ref 0–37)
Bilirubin, Direct: 0.1 mg/dL (ref 0.0–0.3)
TOTAL PROTEIN: 6.4 g/dL (ref 6.0–8.3)
Total Bilirubin: 0.4 mg/dL (ref 0.2–1.2)

## 2015-11-11 LAB — LIPID PANEL
CHOLESTEROL: 221 mg/dL — AB (ref 0–200)
HDL: 31.8 mg/dL — AB (ref >39.00)
NONHDL: 189.66
Total CHOL/HDL Ratio: 7
Triglycerides: 334 mg/dL — ABNORMAL HIGH (ref 0.0–149.0)
VLDL: 66.8 mg/dL — ABNORMAL HIGH (ref 0.0–40.0)

## 2015-11-11 LAB — BASIC METABOLIC PANEL
BUN: 17 mg/dL (ref 6–23)
CALCIUM: 9.2 mg/dL (ref 8.4–10.5)
CO2: 29 mEq/L (ref 19–32)
Chloride: 102 mEq/L (ref 96–112)
Creatinine, Ser: 0.75 mg/dL (ref 0.40–1.50)
GFR: 112.84 mL/min (ref >60.00)
Glucose, Bld: 86 mg/dL (ref 70–99)
Potassium: 4 mEq/L (ref 3.5–5.1)
SODIUM: 137 meq/L (ref 135–145)

## 2015-11-11 LAB — PSA: PSA: 0.44 ng/mL (ref 0.10–4.00)

## 2015-11-11 LAB — TSH: TSH: 3.18 u[IU]/mL (ref 0.35–4.50)

## 2015-11-11 LAB — LDL CHOLESTEROL, DIRECT: LDL DIRECT: 115 mg/dL

## 2015-11-12 LAB — CBC WITH DIFFERENTIAL/PLATELET
BASOS ABS: 0.1 10*3/uL (ref 0.0–0.1)
Basophils Relative: 0.9 % (ref 0.0–3.0)
Eosinophils Absolute: 0.2 10*3/uL (ref 0.0–0.7)
Eosinophils Relative: 2.9 % (ref 0.0–5.0)
HCT: 40.6 % (ref 39.0–52.0)
Hemoglobin: 13.9 g/dL (ref 13.0–17.0)
Lymphocytes Relative: 33.5 % (ref 12.0–46.0)
Lymphs Abs: 2 10*3/uL (ref 0.7–4.0)
MCHC: 34.3 g/dL (ref 30.0–36.0)
MCV: 91.3 fl (ref 78.0–100.0)
MONOS PCT: 7.9 % (ref 3.0–12.0)
Monocytes Absolute: 0.5 10*3/uL (ref 0.1–1.0)
Neutro Abs: 3.3 10*3/uL (ref 1.4–7.7)
Neutrophils Relative %: 54.8 % (ref 43.0–77.0)
Platelets: 179 10*3/uL (ref 150.0–400.0)
RBC: 4.44 Mil/uL (ref 4.22–5.81)
RDW: 13.4 % (ref 11.5–15.5)
WBC: 5.9 10*3/uL (ref 4.0–10.5)

## 2015-11-18 ENCOUNTER — Ambulatory Visit (INDEPENDENT_AMBULATORY_CARE_PROVIDER_SITE_OTHER): Payer: PRIVATE HEALTH INSURANCE | Admitting: Family Medicine

## 2015-11-18 ENCOUNTER — Encounter: Payer: Self-pay | Admitting: Family Medicine

## 2015-11-18 VITALS — BP 110/70 | HR 80 | Temp 99.3°F | Ht 70.0 in | Wt 174.0 lb

## 2015-11-18 DIAGNOSIS — Z0000 Encounter for general adult medical examination without abnormal findings: Secondary | ICD-10-CM

## 2015-11-18 DIAGNOSIS — Z23 Encounter for immunization: Secondary | ICD-10-CM | POA: Diagnosis not present

## 2015-11-18 NOTE — Progress Notes (Signed)
Pre visit review using our clinic review tool, if applicable. No additional management support is needed unless otherwise documented below in the visit note. 

## 2015-11-18 NOTE — Progress Notes (Signed)
Subjective:     Patient ID: Jose Tyler, male   DOB: November 13, 1955, 60 y.o.   MRN: [id number]  HPI Here for physical. He feels he is eating well but does not exercise regularly. He drinks scotch daily and realizes he may need to cut back somewhat. Weight has been stable. Patient had colonoscopy but this was 10 years ago. He had some mild diverticular disease but no polyps Tetanus is up-to-date. No history of shingles vaccine. Nonsmoker. Takes no regular prescription medications  Past Medical History:  Diagnosis Date  . Chronic kidney disease    age 25  . Genital warts   . GERD (gastroesophageal reflux disease)   . Heart murmur    Childhood   Past Surgical History:  Procedure Laterality Date  . KNEE SURGERY Right 2007  . MOUTH SURGERY  2011    reports that he has quit smoking. He does not have any smokeless tobacco history on file. He reports that he drinks alcohol. He reports that he does not use drugs. family history includes Arthritis in his mother; Cancer in his paternal grandfather; Depression in his mother and sister; Hyperlipidemia in his father; Hypertension in his mother and paternal grandmother. No Known Allergies   Review of Systems  Constitutional: Negative for activity change, appetite change, fatigue and fever.  HENT: Negative for congestion, ear pain and trouble swallowing.   Eyes: Negative for pain and visual disturbance.  Respiratory: Negative for cough, shortness of breath and wheezing.   Cardiovascular: Negative for chest pain and palpitations.  Gastrointestinal: Negative for abdominal distention, abdominal pain, blood in stool, constipation, diarrhea, nausea, rectal pain and vomiting.  Genitourinary: Negative for dysuria, hematuria and testicular pain.  Musculoskeletal: Negative for arthralgias and joint swelling.  Skin: Negative for rash.  Neurological: Negative for dizziness, syncope and headaches.  Hematological: Negative for adenopathy.   Psychiatric/Behavioral: Negative for confusion and dysphoric mood.       Objective:   Physical Exam  Constitutional: He is oriented to person, place, and time. He appears well-developed and well-nourished. No distress.  HENT:  Head: Normocephalic and atraumatic.  Right Ear: External ear normal.  Left Ear: External ear normal.  Mouth/Throat: Oropharynx is clear and moist.  Eyes: Conjunctivae and EOM are normal. Pupils are equal, round, and reactive to light.  Neck: Normal range of motion. Neck supple. No thyromegaly present.  Cardiovascular: Normal rate, regular rhythm and normal heart sounds.   No murmur heard. Pulmonary/Chest: No respiratory distress. He has no wheezes. He has no rales.  Abdominal: Soft. Bowel sounds are normal. He exhibits no distension and no mass. There is no tenderness. There is no rebound and no guarding.  Musculoskeletal: He exhibits no edema.  Lymphadenopathy:    He has no cervical adenopathy.  Neurological: He is alert and oriented to person, place, and time. He displays normal reflexes. No cranial nerve deficit.  Skin: No rash noted.  Psychiatric: He has a normal mood and affect.       Assessment:     Physical exam. Patient needs repeat colonoscopy. We discussed shingles vaccine and he has no contraindications and consents    Plan:     -increase frequency of exercise. -Set up repeat colonoscopy -Shingles vaccine given. -Labs reviewed with patient. Handout on lowering triglycerides given -Scale back alcohol use    Eulas Post MD [facility] at [facility]

## 2015-11-18 NOTE — Patient Instructions (Addendum)

## 2015-11-28 ENCOUNTER — Telehealth: Payer: Self-pay | Admitting: Family Medicine

## 2015-11-28 NOTE — Telephone Encounter (Signed)
Pt would like to have a anti-viral Rx for the Shingles he does not have a rash but he has nerve problems on the left side and other shingle symptoms.

## 2015-11-28 NOTE — Telephone Encounter (Signed)
Pt was recently seen on 11/18/2015 for CPE. I dont see any mentioning of this nerve like pain. Did he mention this to you or should we have him come in to discuss?

## 2015-11-28 NOTE — Telephone Encounter (Signed)
Even if he had shingles, anti-viral therapy is generally not recommended if > 72 hours duration.  My suggestion (if he continues to have ?nerve pain) is to consider possible follow up with neurology to clarify source of pain.  May need nerve conduction studies.

## 2015-11-29 NOTE — Telephone Encounter (Signed)
Pt is aware of annotations. Declines a referral at this time.

## 2016-01-10 ENCOUNTER — Ambulatory Visit (INDEPENDENT_AMBULATORY_CARE_PROVIDER_SITE_OTHER): Payer: PRIVATE HEALTH INSURANCE | Admitting: Family Medicine

## 2016-01-10 VITALS — BP 120/78 | HR 89 | Temp 99.0°F | Ht 70.0 in | Wt 172.0 lb

## 2016-01-10 DIAGNOSIS — K4090 Unilateral inguinal hernia, without obstruction or gangrene, not specified as recurrent: Secondary | ICD-10-CM

## 2016-01-10 NOTE — Patient Instructions (Signed)

## 2016-01-10 NOTE — Progress Notes (Signed)
Pre visit review using our clinic review tool, if applicable. No additional management support is needed unless otherwise documented below in the visit note. 

## 2016-01-10 NOTE — Progress Notes (Signed)
Subjective:     Patient ID: Jose Tyler, male   DOB: [birth year], 60 y.o.   MRN: [id number]  HPI  Patient seen with right inguinal bulge noted about 3 weeks ago. Slightly painful with lifting. No prior history of hernia. Pain occasionally radiates toward the scrotum. No dysuria. No pain currently.    Past Medical History:  Diagnosis Date  . Chronic kidney disease    age 73  . Genital warts   . GERD (gastroesophageal reflux disease)   . Heart murmur    Childhood   Past Surgical History:  Procedure Laterality Date  . KNEE SURGERY Right 2007  . MOUTH SURGERY  2011    reports that he has quit smoking. He does not have any smokeless tobacco history on file. He reports that he drinks alcohol. He reports that he does not use drugs. family history includes Arthritis in his mother; Cancer in his paternal grandfather; Depression in his mother and sister; Hyperlipidemia in his father; Hypertension in his mother and paternal grandmother. No Known Allergies  Review of Systems  Gastrointestinal: Negative for abdominal pain.  Genitourinary: Negative for dysuria.  Hematological: Negative for adenopathy.       Objective:   Physical Exam  Constitutional: He appears well-developed and well-nourished. No distress.  Cardiovascular: Normal rate and regular rhythm.   Pulmonary/Chest: Effort normal and breath sounds normal. No respiratory distress. He has no wheezes. He has no rales.  Genitourinary:  Genitourinary Comments: Patient has bulge right inguinal region. Nontender. Soft and easily reducible       Assessment:     Right inguinal hernia    Plan:     Set up surgical referral Reviewed signs and symptoms of strangulation.  Eulas Post MD [facility] at [facility]

## 2016-01-20 ENCOUNTER — Ambulatory Visit: Payer: Self-pay | Admitting: General Surgery

## 2016-01-20 ENCOUNTER — Encounter: Payer: Self-pay | Admitting: Family Medicine

## 2017-01-14 ENCOUNTER — Encounter: Payer: Self-pay | Admitting: Family Medicine

## 2017-05-28 ENCOUNTER — Other Ambulatory Visit: Payer: Self-pay | Admitting: Orthopedic Surgery

## 2017-05-28 DIAGNOSIS — R609 Edema, unspecified: Secondary | ICD-10-CM

## 2017-05-28 DIAGNOSIS — M25562 Pain in left knee: Secondary | ICD-10-CM

## 2017-05-29 ENCOUNTER — Ambulatory Visit
Admission: RE | Admit: 2017-05-29 | Discharge: 2017-05-29 | Disposition: A | Discharge status: Home / Self Care | Payer: PRIVATE HEALTH INSURANCE | Source: Ambulatory Visit | Attending: Orthopedic Surgery | Admitting: Orthopedic Surgery

## 2017-05-29 DIAGNOSIS — M25562 Pain in left knee: Secondary | ICD-10-CM

## 2017-05-29 DIAGNOSIS — R609 Edema, unspecified: Secondary | ICD-10-CM

## 2017-06-08 DIAGNOSIS — Z9889 Other specified postprocedural states: Secondary | ICD-10-CM | POA: Insufficient documentation

## 2018-02-02 ENCOUNTER — Encounter: Payer: Self-pay | Admitting: Family Medicine

## 2018-02-02 ENCOUNTER — Ambulatory Visit (INDEPENDENT_AMBULATORY_CARE_PROVIDER_SITE_OTHER): Payer: PRIVATE HEALTH INSURANCE

## 2018-02-02 ENCOUNTER — Ambulatory Visit: Payer: PRIVATE HEALTH INSURANCE | Admitting: Family Medicine

## 2018-02-02 ENCOUNTER — Ambulatory Visit (INDEPENDENT_AMBULATORY_CARE_PROVIDER_SITE_OTHER): Payer: PRIVATE HEALTH INSURANCE | Admitting: Family Medicine

## 2018-02-02 ENCOUNTER — Other Ambulatory Visit: Payer: Self-pay

## 2018-02-02 VITALS — BP 114/62 | HR 83 | Temp 98.4°F | Wt 182.1 lb

## 2018-02-02 DIAGNOSIS — R059 Cough, unspecified: Secondary | ICD-10-CM

## 2018-02-02 DIAGNOSIS — R05 Cough: Secondary | ICD-10-CM

## 2018-02-02 DIAGNOSIS — R5381 Other malaise: Secondary | ICD-10-CM | POA: Diagnosis not present

## 2018-02-02 LAB — CBC WITH DIFFERENTIAL/PLATELET
BASOS ABS: 0 10*3/uL (ref 0.0–0.1)
Basophils Relative: 0.3 % (ref 0.0–3.0)
Eosinophils Absolute: 0 10*3/uL (ref 0.0–0.7)
Eosinophils Relative: 0.1 % (ref 0.0–5.0)
HCT: 39.1 % (ref 39.0–52.0)
Hemoglobin: 13.5 g/dL (ref 13.0–17.0)
LYMPHS ABS: 1.2 10*3/uL (ref 0.7–4.0)
Lymphocytes Relative: 15.1 % (ref 12.0–46.0)
MCHC: 34.4 g/dL (ref 30.0–36.0)
MCV: 92.6 fl (ref 78.0–100.0)
MONOS PCT: 8.9 % (ref 3.0–12.0)
Monocytes Absolute: 0.7 10*3/uL (ref 0.1–1.0)
NEUTROS PCT: 75.6 % (ref 43.0–77.0)
Neutro Abs: 6 10*3/uL (ref 1.4–7.7)
Platelets: 153 10*3/uL (ref 150.0–400.0)
RBC: 4.23 Mil/uL (ref 4.22–5.81)
RDW: 13.5 % (ref 11.5–15.5)
WBC: 7.9 10*3/uL (ref 4.0–10.5)

## 2018-02-02 MED ORDER — DOXYCYCLINE HYCLATE 100 MG PO CAPS: 100.0000 mg | ORAL_CAPSULE | Freq: Two times a day (BID) | ORAL | 0 refills | Status: DC

## 2018-02-02 NOTE — Patient Instructions (Signed)
Follow up for any fever or increased shortness of breath. 

## 2018-02-02 NOTE — Progress Notes (Signed)
  Subjective:     Patient ID: Jose Tyler, male   DOB: [birth year], 62 y.o.   MRN: [id number]  HPI Patient is a former smoker who was seen with 5-week history of cough.  He states he had typical URI type symptoms at onset but has had persistent cough now for over 5 weeks.  Couple nights ago developed some chills and possible subjective fever.  Feels somewhat better today.  He is also noticed some intermittent lymph node type swelling posterior cervical triangle region.  His wife is had very similar symptoms.  He denies any recent dysuria, abdominal pain, skin rash, tick bites, weight loss, or any travels.  No sore throat.  No chronic sinusitis symptoms.  Cough mostly nonproductive.  No hemoptysis.  No dyspnea.  No pleuritic pain.  No recent diarrhea. He does have some nonspecific body aches stating that he aches "all over".  Past Medical History:  Diagnosis Date  . Chronic kidney disease    age 14  . Genital warts   . GERD (gastroesophageal reflux disease)   . Heart murmur    Childhood   Past Surgical History:  Procedure Laterality Date  . KNEE SURGERY Right 2007  . MOUTH SURGERY  2011    reports that he has quit smoking. He has never used smokeless tobacco. He reports that he drinks alcohol. He reports that he does not use drugs. family history includes Arthritis in his mother; Cancer in his paternal grandfather; Depression in his mother and sister; Hyperlipidemia in his father; Hypertension in his mother and paternal grandmother. No Known Allergies   Review of Systems  Constitutional: Positive for chills and fatigue.  HENT: Negative for ear pain and sore throat.   Respiratory: Positive for cough. Negative for shortness of breath and wheezing.   Cardiovascular: Negative for chest pain and leg swelling.  Gastrointestinal: Negative for abdominal pain, diarrhea, nausea and vomiting.  Genitourinary: Negative for dysuria and flank pain.  Skin: Negative for rash.  Neurological: Negative  for headaches.  Hematological: Does not bruise/bleed easily.       Objective:   Physical Exam  Constitutional: He appears well-developed and well-nourished.  HENT:  Right Ear: External ear normal.  Left Ear: External ear normal.  Mouth/Throat: Oropharynx is clear and moist.  Neck: Neck supple. No thyromegaly present.  Cardiovascular: Normal rate and regular rhythm.  Pulmonary/Chest: Effort normal and breath sounds normal. No stridor. He has no wheezes. He has no rales.  Abdominal: Soft. He exhibits no mass. There is no tenderness. There is no rebound and no guarding.  No hepatomegaly or splenomegaly  Musculoskeletal: He exhibits no edema.  Lymphadenopathy:    He has no cervical adenopathy.  Skin: No rash noted.       Assessment:     Patient presents with 5-week history of cough and increased malaise.  He relates 2-day history of chills without documented fever.  Question is whether this is all one illness or if he has secondary process/infection.  He has not had any red flags such as hemoptysis, dyspnea, weight changes.  Pt non-toxic in appearance.    Plan:     -Check CBC with differential and chest x-ray -start Doxycycline 100 mg po bid for 10 days  Eulas Post MD [facility] at [facility]

## 2018-12-17 ENCOUNTER — Encounter: Payer: Self-pay | Admitting: Gastroenterology

## 2019-01-23 ENCOUNTER — Encounter: Payer: Self-pay | Admitting: Gastroenterology

## 2019-03-13 ENCOUNTER — Encounter: Payer: Self-pay | Admitting: Gastroenterology

## 2019-03-13 ENCOUNTER — Ambulatory Visit (AMBULATORY_SURGERY_CENTER): Payer: PRIVATE HEALTH INSURANCE | Admitting: *Deleted

## 2019-03-13 ENCOUNTER — Other Ambulatory Visit: Payer: Self-pay

## 2019-03-13 VITALS — Temp 97.5°F | Ht 70.0 in | Wt 175.0 lb

## 2019-03-13 DIAGNOSIS — Z1211 Encounter for screening for malignant neoplasm of colon: Secondary | ICD-10-CM

## 2019-03-13 DIAGNOSIS — Z1159 Encounter for screening for other viral diseases: Secondary | ICD-10-CM

## 2019-03-13 MED ORDER — NA SULFATE-K SULFATE-MG SULF 17.5-3.13-1.6 GM/177ML PO SOLN: ORAL | 0 refills | Status: DC

## 2019-03-13 NOTE — Progress Notes (Signed)
Patient is here in-person for PV. Patient denies any allergies to eggs or soy. Patient denies any problems with anesthesia/sedation. Patient denies any oxygen use at home. Patient denies taking any diet/weight loss medications or blood thinners. Patient is not being treated for MRSA or C-diff. EMMI education assisgned to the patient for the procedure, this was explained and instructions given to patient. COVID-19 screening test is on 11/25, the pt is aware. Pt is aware that care partner will wait in the car during procedure; if they feel like they will be too hot or cold to wait in the car; they may wait in the 4 th floor lobby. Patient is aware to bring only one care partner. We want them to wear a mask (we do not have any that we can provide them), practice social distancing, and we will check their temperatures when they get here.  I did remind the patient that their care partner needs to stay in the parking lot the entire time and have a cell phone available, we will call them when the pt is ready for discharge. Patient will wear mask into building.    Suprep $15 off coupon given to the patient.

## 2019-03-22 ENCOUNTER — Other Ambulatory Visit: Payer: Self-pay | Admitting: Gastroenterology

## 2019-03-22 ENCOUNTER — Ambulatory Visit (INDEPENDENT_AMBULATORY_CARE_PROVIDER_SITE_OTHER): Payer: PRIVATE HEALTH INSURANCE

## 2019-03-22 DIAGNOSIS — Z1159 Encounter for screening for other viral diseases: Secondary | ICD-10-CM

## 2019-03-24 LAB — SARS CORONAVIRUS 2 (TAT 6-24 HRS): SARS Coronavirus 2: NEGATIVE

## 2019-03-27 ENCOUNTER — Other Ambulatory Visit: Payer: Self-pay

## 2019-03-27 ENCOUNTER — Ambulatory Visit (AMBULATORY_SURGERY_CENTER): Payer: PRIVATE HEALTH INSURANCE | Admitting: Gastroenterology

## 2019-03-27 ENCOUNTER — Encounter: Payer: Self-pay | Admitting: Gastroenterology

## 2019-03-27 VITALS — BP 106/63 | HR 68 | Temp 98.0°F | Resp 13 | Ht 70.0 in | Wt 175.0 lb

## 2019-03-27 DIAGNOSIS — D120 Benign neoplasm of cecum: Secondary | ICD-10-CM

## 2019-03-27 DIAGNOSIS — Z1211 Encounter for screening for malignant neoplasm of colon: Secondary | ICD-10-CM

## 2019-03-27 MED ORDER — SODIUM CHLORIDE 0.9 % IV SOLN: 500.0000 mL | Freq: Once | INTRAVENOUS | Status: DC

## 2019-03-27 NOTE — Progress Notes (Signed)
Called to room to assist during endoscopic procedure.  Patient ID and intended procedure confirmed with present staff. Received instructions for my participation in the procedure from the performing physician.  

## 2019-03-27 NOTE — Patient Instructions (Signed)
Information on polyps and diverticulosis given to you today.  Await pathology results.  YOU HAD AN ENDOSCOPIC PROCEDURE TODAY AT THE [facility]:   Refer to the procedure report that was given to you for any specific questions about what was found during the examination.  If the procedure report does not answer your questions, please call your gastroenterologist to clarify.  If you requested that your care partner not be given the details of your procedure findings, then the procedure report has been included in a sealed envelope for you to review at your convenience later.  YOU SHOULD EXPECT: Some feelings of bloating in the abdomen. Passage of more gas than usual.  Walking can help get rid of the air that was put into your GI tract during the procedure and reduce the bloating. If you had a lower endoscopy (such as a colonoscopy or flexible sigmoidoscopy) you may notice spotting of blood in your stool or on the toilet paper. If you underwent a bowel prep for your procedure, you may not have a normal bowel movement for a few days.  Please Note:  You might notice some irritation and congestion in your nose or some drainage.  This is from the oxygen used during your procedure.  There is no need for concern and it should clear up in a day or so.  SYMPTOMS TO REPORT IMMEDIATELY:   Following lower endoscopy (colonoscopy or flexible sigmoidoscopy):  Excessive amounts of blood in the stool  Significant tenderness or worsening of abdominal pains  Swelling of the abdomen that is new, acute  Fever of 100F or higher   For urgent or emergent issues, a gastroenterologist can be reached at any hour by calling [phone].   DIET:  We do recommend a small meal at first, but then you may proceed to your regular diet.  Drink plenty of fluids but you should avoid alcoholic beverages for 24 hours.  ACTIVITY:  You should plan to take it easy for the rest of today and you should NOT DRIVE or use  heavy machinery until tomorrow (because of the sedation medicines used during the test).    FOLLOW UP: Our staff will call the number listed on your records 48-72 hours following your procedure to check on you and address any questions or concerns that you may have regarding the information given to you following your procedure. If we do not reach you, we will leave a message.  We will attempt to reach you two times.  During this call, we will ask if you have developed any symptoms of COVID 19. If you develop any symptoms (ie: fever, flu-like symptoms, shortness of breath, cough etc.) before then, please call [phone].  If you test positive for Covid 19 in the 2 weeks post procedure, please call and report this information to us.    If any biopsies were taken you will be contacted by phone or by letter within the next 1-3 weeks.  Please call us at [phone] if you have not heard about the biopsies in 3 weeks.    SIGNATURES/CONFIDENTIALITY: You and/or your care partner have signed paperwork which will be entered into your electronic medical record.  These signatures attest to the fact that that the information above on your After Visit Summary has been reviewed and is understood.  Full responsibility of the confidentiality of this discharge information lies with you and/or your care-partner. 

## 2019-03-27 NOTE — Op Note (Signed)
[place] Patient Name: Jose Tyler Procedure Date: 03/27/2019 7:56 AM MRN: [id number] Endoscopist: [place]. Loletha Carrow , MD Age: 63 Referring MD:  Date of Birth: 1955/08/12 Gender: Male Account #: [id number] Procedure:                Colonoscopy Indications:              Screening for colorectal malignant neoplasm (no                            polyps 12/2008) Medicines:                Monitored Anesthesia Care Procedure:                Pre-Anesthesia Assessment:                           - Prior to the procedure, a History and Physical                            was performed, and patient medications and                            allergies were reviewed. The patient's tolerance of                            previous anesthesia was also reviewed. The risks                            and benefits of the procedure and the sedation                            options and risks were discussed with the patient.                            All questions were answered, and informed consent                            was obtained. Prior Anticoagulants: The patient has                            taken no previous anticoagulant or antiplatelet                            agents. ASA Grade Assessment: II - A patient with                            mild systemic disease. After reviewing the risks                            and benefits, the patient was deemed in                            satisfactory condition to undergo the procedure.  After obtaining informed consent, the colonoscope                            was passed under direct vision. Throughout the                            procedure, the patient's blood pressure, pulse, and                            oxygen saturations were monitored continuously. The                            Colonoscope was introduced through the anus and                            advanced to the the cecum, identified by                   appendiceal orifice and ileocecal valve. The                            colonoscopy was performed without difficulty. The                            patient tolerated the procedure well. The quality                            of the bowel preparation was excellent. The                            ileocecal valve, appendiceal orifice, and rectum                            were photographed. The bowel preparation used was                            SUPREP via split dose instruction. Scope In: 8:02:20 AM Scope Out: 8:15:50 AM Scope Withdrawal Time: 0 hours 8 minutes 22 seconds  Total Procedure Duration: 0 hours 13 minutes 30 seconds  Findings:                 The perianal and digital rectal examinations were                            normal.                           A diminutive polyp was found in the cecum. The                            polyp was sessile. The polyp was removed with a                            cold biopsy forceps. Resection and retrieval were  complete.                           Multiple diverticula were found in the left colon                            and right colon.                           The exam was otherwise without abnormality on                            direct and retroflexion views. Complications:            No immediate complications. Estimated Blood Loss:     Estimated blood loss was minimal. Impression:               - One diminutive polyp in the cecum, removed with a                            cold biopsy forceps. Resected and retrieved.                           - Diverticulosis in the left colon and in the right                            colon.                           - The examination was otherwise normal on direct                            and retroflexion views. Recommendation:           - Patient has a contact number available for                            emergencies. The signs and symptoms of  potential                            delayed complications were discussed with the                            patient. Return to normal activities tomorrow.                            Written discharge instructions were provided to the                            patient.                           - Resume previous diet.                           - Continue present medications.                           -  Await pathology results.                           - Repeat colonoscopy is recommended for                            surveillance. The colonoscopy date will be                            determined after pathology results from today's                            exam become available for review. Ruben L. Loletha Carrow, MD 03/27/2019 8:20:03 AM This report has been signed electronically.

## 2019-03-27 NOTE — Progress Notes (Signed)
PT taken to PACU. Monitors in place. VSS. Report given to RN. 

## 2019-03-27 NOTE — Progress Notes (Signed)
Pt's states no medical or surgical changes since previsit or office visit.  Temp-jb  V/s-cw 

## 2019-03-29 ENCOUNTER — Telehealth: Payer: Self-pay

## 2019-03-29 NOTE — Telephone Encounter (Signed)
  Follow up Call-  Call back number 03/27/2019  Post procedure Call Back phone  # [phone]  Permission to leave phone message Yes  Some recent data might be hidden     Patient questions:  Do you have a fever, pain , or abdominal swelling? No. Pain Score  0 *  Have you tolerated food without any problems? Yes.    Have you been able to return to your normal activities? Yes.    Do you have any questions about your discharge instructions: Diet   No. Medications  No. Follow up visit  No.  Do you have questions or concerns about your Care? No.  Actions: * If pain score is 4 or above: No action needed, pain <4.  1. Have you developed a fever since your procedure? no  2.   Have you had an respiratory symptoms (SOB or cough) since your procedure? no  3.   Have you tested positive for COVID 19 since your procedure no  4.   Have you had any family members/close contacts diagnosed with the COVID 19 since your procedure?  no   If yes to any of these questions please route to Joylene John, RN and Alphonsa Gin, Therapist, sports.

## 2019-03-30 ENCOUNTER — Encounter: Payer: Self-pay | Admitting: Gastroenterology

## 2020-01-04 IMAGING — MR MR KNEE*L* W/O CM
4 of 5 series · 19 of 40 positions shown · non-contrast
Comparison: None.

CLINICAL DATA: Lt medial knee pain on and off x 10 yrs. Increased
pain climbing stairs or ladders.

EXAM:
MRI OF THE LEFT KNEE WITHOUT CONTRAST
TECHNIQUE: Multiplanar, multisequence MR imaging of the knee was performed. No
intravenous contrast was administered.

[Series 3: PD fat-sat · axial · 3.5mm · 0.31mm/px · z∈[-30,+75]mm · 7 of 26 slices shown (1 of 3)]
[im 1/26]
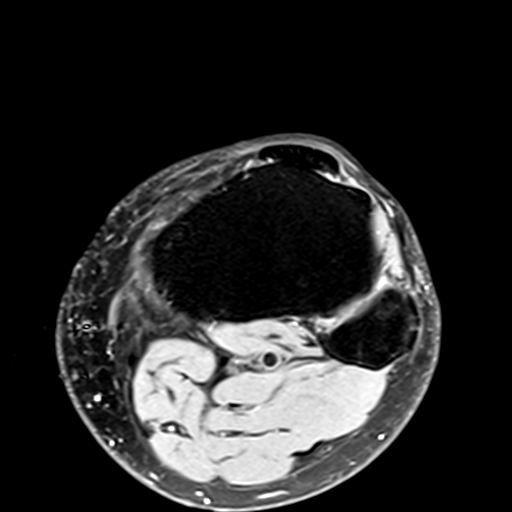
[im 5/26]
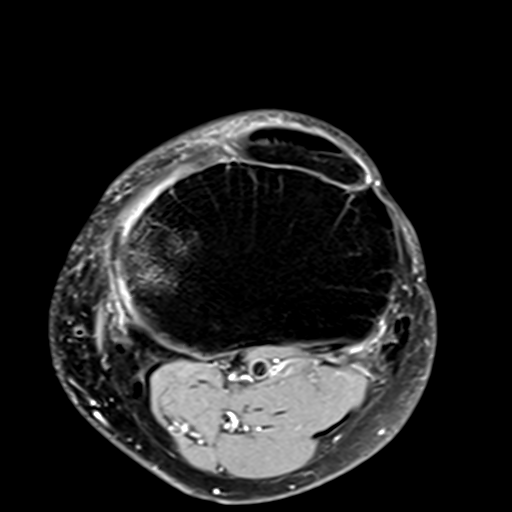
[im 9/26]
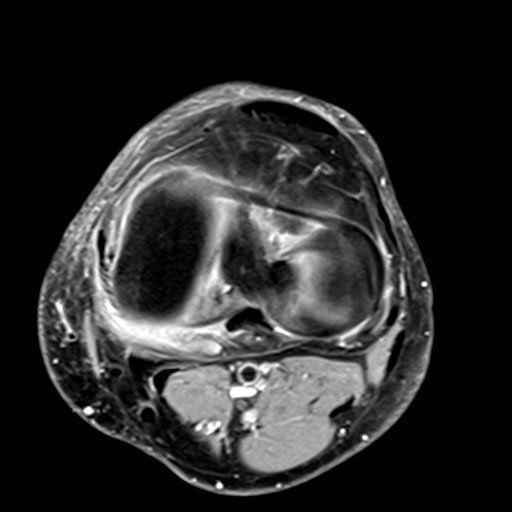
[im 13/26]
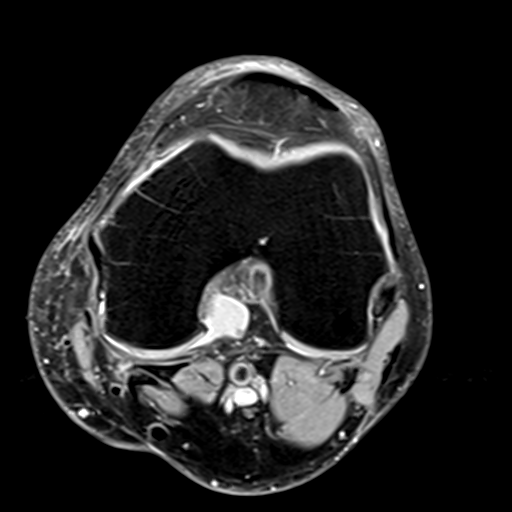
[im 17/26]
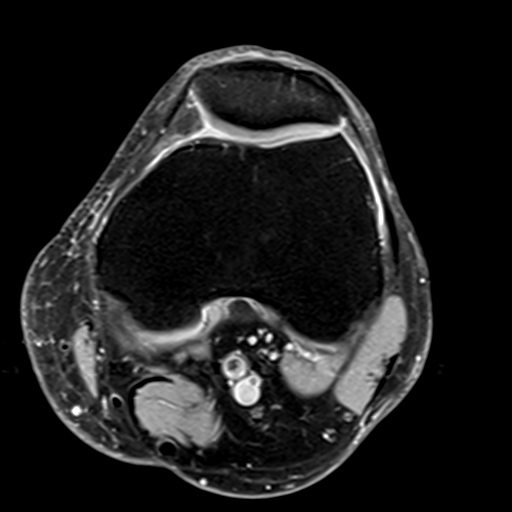
[im 21/26]
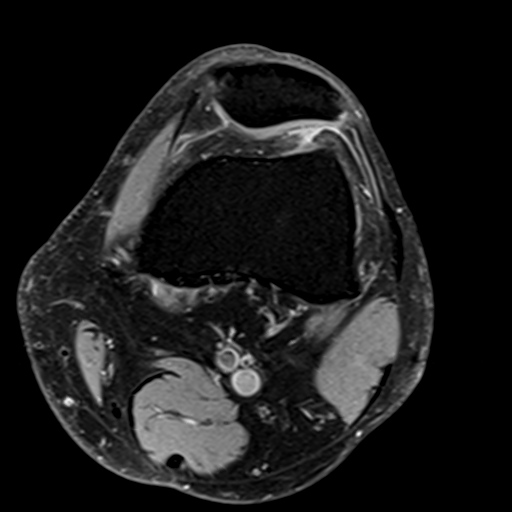
[im 26/26]
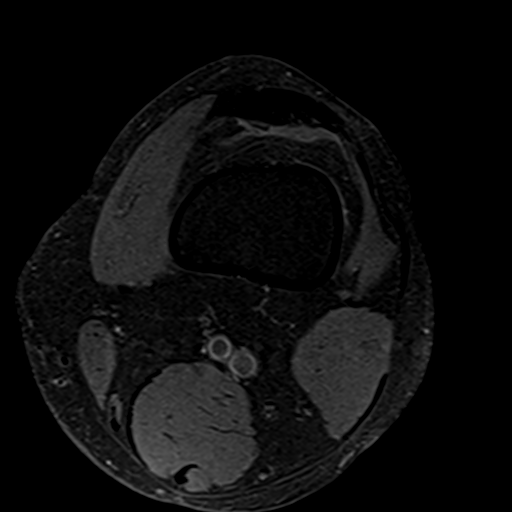

[Series 4: PD fat-sat · coronal · 3.5mm · 0.29mm/px · 6 of 25 slices shown (2 of 3)]
[im 1/25]
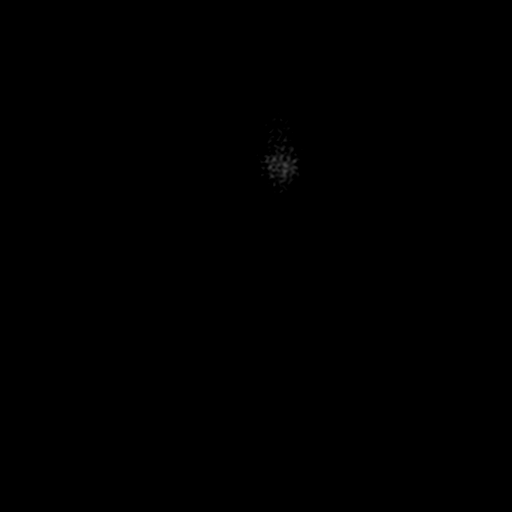
[im 4/25]
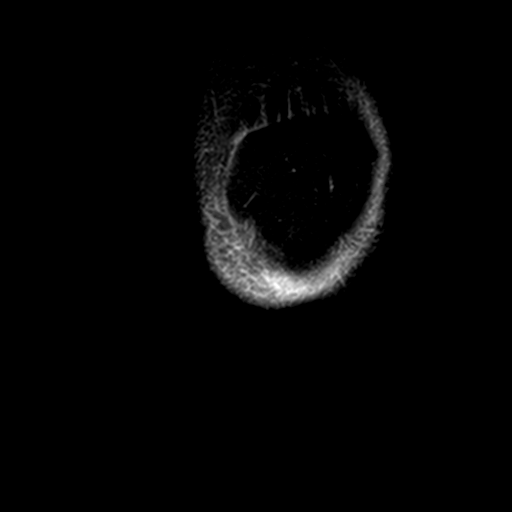
[im 7/25]
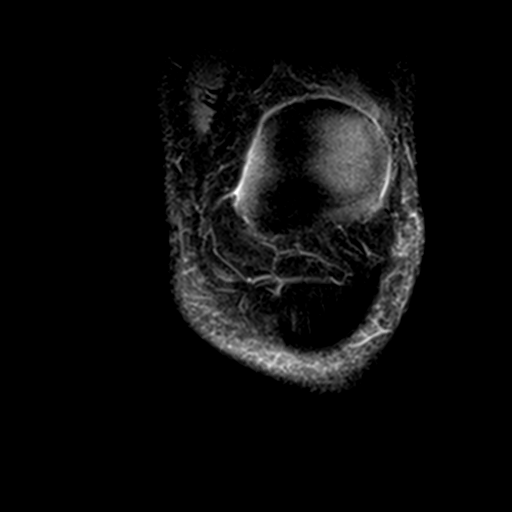
[im 11/25]
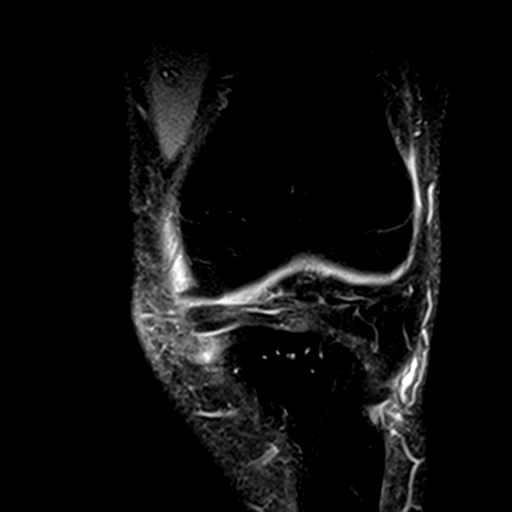
[im 14/25]
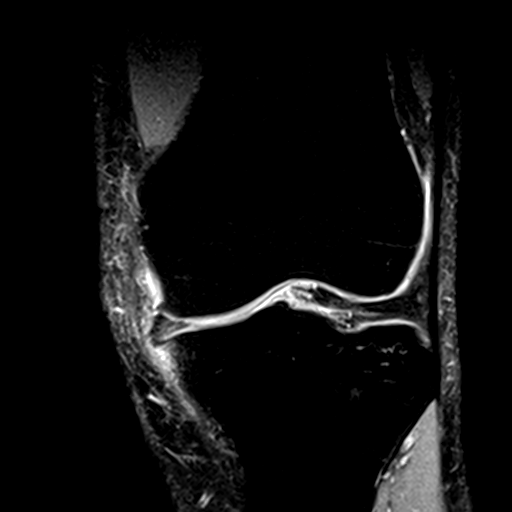
[im 21/25]
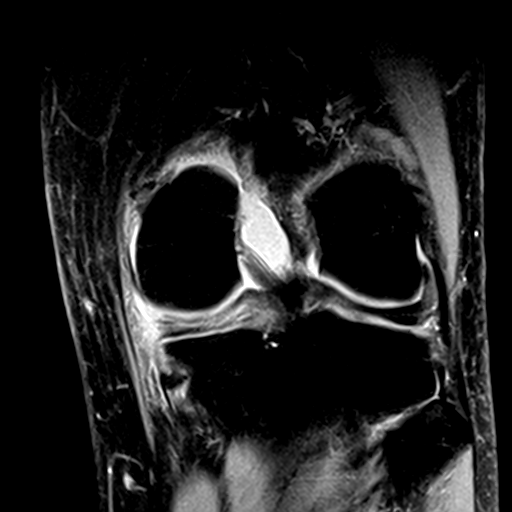

[Series 5: T2 fat-sat · coronal · 3.5mm · 0.29mm/px · 3 of 25 slices shown]
[im 4/25]
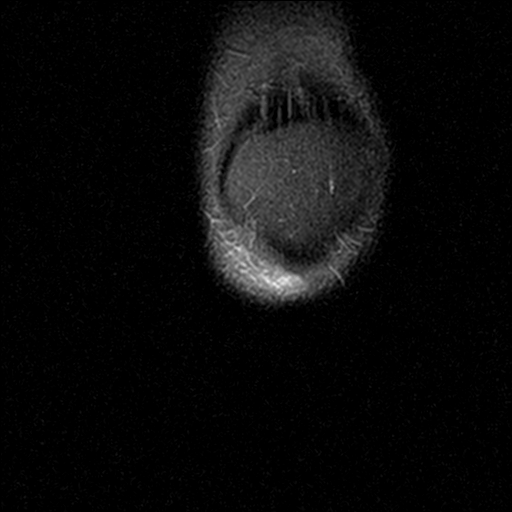
[im 14/25]
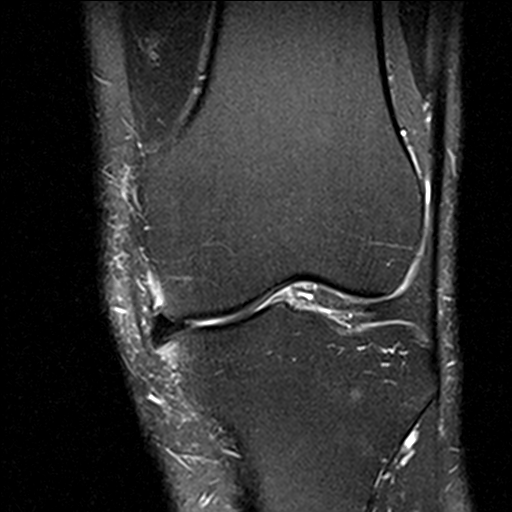
[im 21/25]
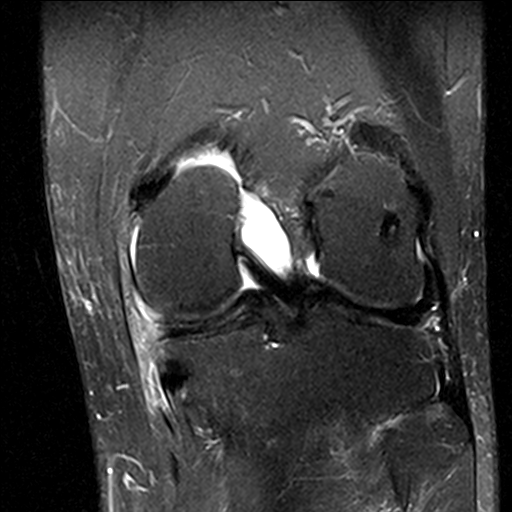

[Series 7: PD fat-sat · sagittal · 3.2mm · 0.29mm/px · 3 of 29 slices shown (3 of 3)]
[im 4/29]
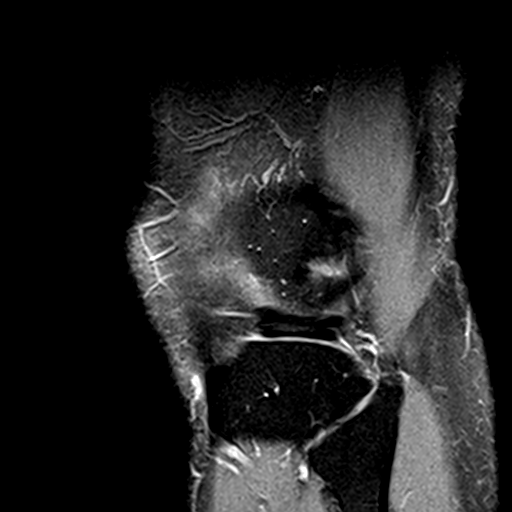
[im 15/29]
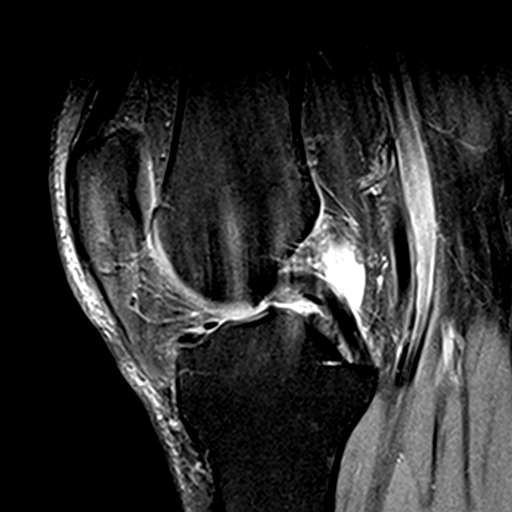
[im 25/29]
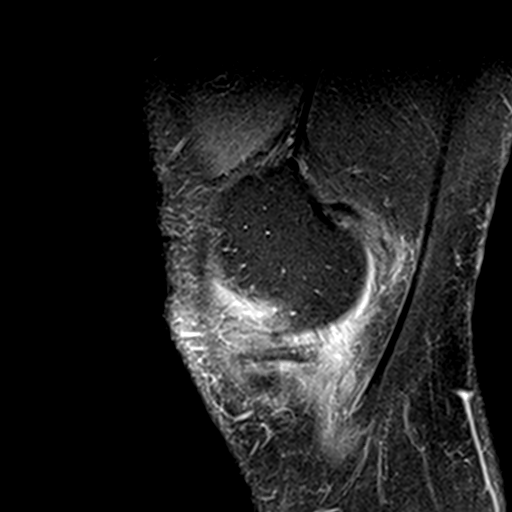

[19 of 40 positions shown; findings below may reference images not displayed]

FINDINGS: MENISCI

Medial meniscus: Severe complex tear of the body and posterior horn
of the medial meniscus with a radial component.

Lateral meniscus:  Intact.

LIGAMENTS

Cruciates:  Intact ACL and PCL.

Collaterals: Medial collateral ligament is intact. Lateral
collateral ligament complex is intact.

CARTILAGE

Patellofemoral:  No chondral defect.

Medial: Partial-thickness cartilage loss with areas of
full-thickness cartilage loss along the weight-bearing surface of
the medial femoral condyle and medial tibial plateau with
subchondral reactive marrow edema in the medial tibial plateau.
Small marginal osteophytes.

Lateral:  No chondral defect.

Joint: No joint effusion. Minimal edema in Hoffa's fat. No plical
thickening.

Popliteal Fossa:  No Baker's cyst.  Intact popliteus tendon.

Extensor Mechanism: Intact quadriceps tendon. Intact patellar
tendon. Intact medial patellar retinaculum. Intact lateral patellar
retinaculum. Intact MPFL.

Bones: No acute osseous abnormality. No aggressive osseous lesion. 8
x 7 mm peripherally sclerotic and centrally T1 hyperintense bone
lesion in the lateral femoral condyle most consistent with a small
chronic bone infarct.

Other: No fluid collection or hematoma.  Muscles are normal.
IMPRESSION: 1. Severe complex tear of the body and posterior horn of the medial
meniscus with a radial component.
2. Partial-thickness cartilage loss with areas of full-thickness
cartilage loss along the weight-bearing surface of the medial
femoral condyle and medial tibial plateau with subchondral reactive
marrow edema in the medial tibial plateau.

## 2021-01-15 DIAGNOSIS — D2271 Melanocytic nevi of right lower limb, including hip: Secondary | ICD-10-CM | POA: Diagnosis not present

## 2021-01-15 DIAGNOSIS — D485 Neoplasm of uncertain behavior of skin: Secondary | ICD-10-CM | POA: Diagnosis not present

## 2021-01-15 DIAGNOSIS — D225 Melanocytic nevi of trunk: Secondary | ICD-10-CM | POA: Diagnosis not present

## 2021-01-15 DIAGNOSIS — D2361 Other benign neoplasm of skin of right upper limb, including shoulder: Secondary | ICD-10-CM | POA: Diagnosis not present

## 2021-01-15 DIAGNOSIS — D1801 Hemangioma of skin and subcutaneous tissue: Secondary | ICD-10-CM | POA: Diagnosis not present

## 2021-01-15 DIAGNOSIS — L814 Other melanin hyperpigmentation: Secondary | ICD-10-CM | POA: Diagnosis not present

## 2021-01-15 DIAGNOSIS — D2272 Melanocytic nevi of left lower limb, including hip: Secondary | ICD-10-CM | POA: Diagnosis not present

## 2021-03-04 DIAGNOSIS — Z23 Encounter for immunization: Secondary | ICD-10-CM | POA: Diagnosis not present

## 2021-09-19 DIAGNOSIS — Z1329 Encounter for screening for other suspected endocrine disorder: Secondary | ICD-10-CM | POA: Diagnosis not present

## 2021-09-19 DIAGNOSIS — J302 Other seasonal allergic rhinitis: Secondary | ICD-10-CM | POA: Diagnosis not present

## 2021-09-19 DIAGNOSIS — Z131 Encounter for screening for diabetes mellitus: Secondary | ICD-10-CM | POA: Diagnosis not present

## 2021-09-19 DIAGNOSIS — Z136 Encounter for screening for cardiovascular disorders: Secondary | ICD-10-CM | POA: Diagnosis not present

## 2021-09-19 DIAGNOSIS — Z125 Encounter for screening for malignant neoplasm of prostate: Secondary | ICD-10-CM | POA: Diagnosis not present

## 2021-09-19 DIAGNOSIS — Z7189 Other specified counseling: Secondary | ICD-10-CM | POA: Diagnosis not present

## 2021-09-19 DIAGNOSIS — M545 Low back pain, unspecified: Secondary | ICD-10-CM | POA: Diagnosis not present

## 2021-09-19 DIAGNOSIS — Z23 Encounter for immunization: Secondary | ICD-10-CM | POA: Diagnosis not present

## 2021-09-19 DIAGNOSIS — Z1211 Encounter for screening for malignant neoplasm of colon: Secondary | ICD-10-CM | POA: Diagnosis not present

## 2021-09-19 DIAGNOSIS — N401 Enlarged prostate with lower urinary tract symptoms: Secondary | ICD-10-CM | POA: Diagnosis not present

## 2021-09-19 DIAGNOSIS — R350 Frequency of micturition: Secondary | ICD-10-CM | POA: Diagnosis not present

## 2021-09-19 DIAGNOSIS — Z0001 Encounter for general adult medical examination with abnormal findings: Secondary | ICD-10-CM | POA: Diagnosis not present

## 2021-09-23 ENCOUNTER — Other Ambulatory Visit: Payer: Self-pay | Admitting: Internal Medicine

## 2021-09-23 DIAGNOSIS — Z136 Encounter for screening for cardiovascular disorders: Secondary | ICD-10-CM

## 2021-09-25 DIAGNOSIS — Z131 Encounter for screening for diabetes mellitus: Secondary | ICD-10-CM | POA: Diagnosis not present

## 2021-09-25 DIAGNOSIS — Z125 Encounter for screening for malignant neoplasm of prostate: Secondary | ICD-10-CM | POA: Diagnosis not present

## 2021-09-25 DIAGNOSIS — Z1329 Encounter for screening for other suspected endocrine disorder: Secondary | ICD-10-CM | POA: Diagnosis not present

## 2021-09-25 DIAGNOSIS — Z0001 Encounter for general adult medical examination with abnormal findings: Secondary | ICD-10-CM | POA: Diagnosis not present

## 2021-09-25 DIAGNOSIS — Z136 Encounter for screening for cardiovascular disorders: Secondary | ICD-10-CM | POA: Diagnosis not present

## 2021-09-25 DIAGNOSIS — R252 Cramp and spasm: Secondary | ICD-10-CM | POA: Diagnosis not present

## 2021-10-06 ENCOUNTER — Ambulatory Visit
Admission: RE | Admit: 2021-10-06 | Discharge: 2021-10-06 | Disposition: A | Discharge status: Home / Self Care | Payer: Medicare HMO | Source: Ambulatory Visit | Attending: Internal Medicine | Admitting: Internal Medicine

## 2021-10-06 DIAGNOSIS — Z136 Encounter for screening for cardiovascular disorders: Secondary | ICD-10-CM

## 2021-10-15 ENCOUNTER — Ambulatory Visit: Payer: PRIVATE HEALTH INSURANCE | Admitting: Family Medicine

## 2021-12-26 DIAGNOSIS — E785 Hyperlipidemia, unspecified: Secondary | ICD-10-CM | POA: Diagnosis not present

## 2021-12-26 DIAGNOSIS — R891 Abnormal level of hormones in specimens from other organs, systems and tissues: Secondary | ICD-10-CM | POA: Diagnosis not present

## 2022-01-02 DIAGNOSIS — M791 Myalgia, unspecified site: Secondary | ICD-10-CM | POA: Diagnosis not present

## 2022-01-02 DIAGNOSIS — Z136 Encounter for screening for cardiovascular disorders: Secondary | ICD-10-CM | POA: Diagnosis not present

## 2022-01-02 DIAGNOSIS — D229 Melanocytic nevi, unspecified: Secondary | ICD-10-CM | POA: Diagnosis not present

## 2022-01-02 DIAGNOSIS — E785 Hyperlipidemia, unspecified: Secondary | ICD-10-CM | POA: Diagnosis not present

## 2022-01-02 DIAGNOSIS — R252 Cramp and spasm: Secondary | ICD-10-CM | POA: Diagnosis not present

## 2022-01-02 DIAGNOSIS — M13862 Other specified arthritis, left knee: Secondary | ICD-10-CM | POA: Diagnosis not present

## 2022-01-02 DIAGNOSIS — M545 Low back pain, unspecified: Secondary | ICD-10-CM | POA: Diagnosis not present

## 2022-01-02 DIAGNOSIS — M13861 Other specified arthritis, right knee: Secondary | ICD-10-CM | POA: Diagnosis not present

## 2022-01-02 DIAGNOSIS — M19041 Primary osteoarthritis, right hand: Secondary | ICD-10-CM | POA: Diagnosis not present

## 2022-01-02 DIAGNOSIS — Z23 Encounter for immunization: Secondary | ICD-10-CM | POA: Diagnosis not present

## 2022-01-02 DIAGNOSIS — J302 Other seasonal allergic rhinitis: Secondary | ICD-10-CM | POA: Diagnosis not present

## 2022-01-02 DIAGNOSIS — N401 Enlarged prostate with lower urinary tract symptoms: Secondary | ICD-10-CM | POA: Diagnosis not present

## 2022-01-23 DIAGNOSIS — L821 Other seborrheic keratosis: Secondary | ICD-10-CM | POA: Diagnosis not present

## 2022-01-23 DIAGNOSIS — D225 Melanocytic nevi of trunk: Secondary | ICD-10-CM | POA: Diagnosis not present

## 2022-01-23 DIAGNOSIS — D1801 Hemangioma of skin and subcutaneous tissue: Secondary | ICD-10-CM | POA: Diagnosis not present

## 2022-01-23 DIAGNOSIS — D2261 Melanocytic nevi of right upper limb, including shoulder: Secondary | ICD-10-CM | POA: Diagnosis not present

## 2022-01-23 DIAGNOSIS — D2271 Melanocytic nevi of right lower limb, including hip: Secondary | ICD-10-CM | POA: Diagnosis not present

## 2022-06-30 DIAGNOSIS — E785 Hyperlipidemia, unspecified: Secondary | ICD-10-CM | POA: Diagnosis not present

## 2022-06-30 DIAGNOSIS — N08 Glomerular disorders in diseases classified elsewhere: Secondary | ICD-10-CM | POA: Diagnosis not present

## 2022-07-10 DIAGNOSIS — S66911A Strain of unspecified muscle, fascia and tendon at wrist and hand level, right hand, initial encounter: Secondary | ICD-10-CM | POA: Diagnosis not present

## 2022-07-10 DIAGNOSIS — E785 Hyperlipidemia, unspecified: Secondary | ICD-10-CM | POA: Diagnosis not present

## 2022-07-10 DIAGNOSIS — N401 Enlarged prostate with lower urinary tract symptoms: Secondary | ICD-10-CM | POA: Diagnosis not present

## 2022-07-10 DIAGNOSIS — J302 Other seasonal allergic rhinitis: Secondary | ICD-10-CM | POA: Diagnosis not present

## 2022-07-10 DIAGNOSIS — R739 Hyperglycemia, unspecified: Secondary | ICD-10-CM | POA: Diagnosis not present

## 2022-07-30 DIAGNOSIS — L814 Other melanin hyperpigmentation: Secondary | ICD-10-CM | POA: Diagnosis not present

## 2022-07-30 DIAGNOSIS — L820 Inflamed seborrheic keratosis: Secondary | ICD-10-CM | POA: Diagnosis not present

## 2022-10-01 DIAGNOSIS — M25561 Pain in right knee: Secondary | ICD-10-CM | POA: Diagnosis not present

## 2022-10-01 DIAGNOSIS — M170 Bilateral primary osteoarthritis of knee: Secondary | ICD-10-CM | POA: Diagnosis not present

## 2022-10-01 DIAGNOSIS — M25562 Pain in left knee: Secondary | ICD-10-CM | POA: Diagnosis not present

## 2022-11-11 DIAGNOSIS — R69 Illness, unspecified: Secondary | ICD-10-CM | POA: Diagnosis not present

## 2022-12-29 DIAGNOSIS — Z131 Encounter for screening for diabetes mellitus: Secondary | ICD-10-CM | POA: Diagnosis not present

## 2022-12-29 DIAGNOSIS — E785 Hyperlipidemia, unspecified: Secondary | ICD-10-CM | POA: Diagnosis not present

## 2022-12-29 DIAGNOSIS — Z0001 Encounter for general adult medical examination with abnormal findings: Secondary | ICD-10-CM | POA: Diagnosis not present

## 2022-12-29 DIAGNOSIS — Z125 Encounter for screening for malignant neoplasm of prostate: Secondary | ICD-10-CM | POA: Diagnosis not present

## 2023-01-19 DIAGNOSIS — M170 Bilateral primary osteoarthritis of knee: Secondary | ICD-10-CM | POA: Diagnosis not present

## 2023-01-26 DIAGNOSIS — D485 Neoplasm of uncertain behavior of skin: Secondary | ICD-10-CM | POA: Diagnosis not present

## 2023-01-26 DIAGNOSIS — D2262 Melanocytic nevi of left upper limb, including shoulder: Secondary | ICD-10-CM | POA: Diagnosis not present

## 2023-01-26 DIAGNOSIS — D2261 Melanocytic nevi of right upper limb, including shoulder: Secondary | ICD-10-CM | POA: Diagnosis not present

## 2023-01-26 DIAGNOSIS — D1801 Hemangioma of skin and subcutaneous tissue: Secondary | ICD-10-CM | POA: Diagnosis not present

## 2023-01-26 DIAGNOSIS — L821 Other seborrheic keratosis: Secondary | ICD-10-CM | POA: Diagnosis not present

## 2023-01-26 DIAGNOSIS — L814 Other melanin hyperpigmentation: Secondary | ICD-10-CM | POA: Diagnosis not present

## 2023-01-26 DIAGNOSIS — D225 Melanocytic nevi of trunk: Secondary | ICD-10-CM | POA: Diagnosis not present

## 2023-03-17 DIAGNOSIS — M25561 Pain in right knee: Secondary | ICD-10-CM | POA: Diagnosis not present

## 2023-03-17 DIAGNOSIS — M170 Bilateral primary osteoarthritis of knee: Secondary | ICD-10-CM | POA: Diagnosis not present

## 2023-03-17 DIAGNOSIS — Z7409 Other reduced mobility: Secondary | ICD-10-CM | POA: Diagnosis not present

## 2023-03-17 DIAGNOSIS — M1711 Unilateral primary osteoarthritis, right knee: Secondary | ICD-10-CM | POA: Diagnosis not present

## 2023-03-17 DIAGNOSIS — G8929 Other chronic pain: Secondary | ICD-10-CM | POA: Diagnosis not present

## 2023-03-17 DIAGNOSIS — Z789 Other specified health status: Secondary | ICD-10-CM | POA: Diagnosis not present

## 2023-03-22 DIAGNOSIS — G8918 Other acute postprocedural pain: Secondary | ICD-10-CM | POA: Diagnosis not present

## 2023-03-22 DIAGNOSIS — M25761 Osteophyte, right knee: Secondary | ICD-10-CM | POA: Diagnosis not present

## 2023-03-22 DIAGNOSIS — M1711 Unilateral primary osteoarthritis, right knee: Secondary | ICD-10-CM | POA: Diagnosis not present

## 2023-03-22 DIAGNOSIS — Z96651 Presence of right artificial knee joint: Secondary | ICD-10-CM | POA: Diagnosis not present

## 2023-04-01 DIAGNOSIS — Z789 Other specified health status: Secondary | ICD-10-CM | POA: Diagnosis not present

## 2023-04-01 DIAGNOSIS — M25661 Stiffness of right knee, not elsewhere classified: Secondary | ICD-10-CM | POA: Diagnosis not present

## 2023-04-01 DIAGNOSIS — Z96651 Presence of right artificial knee joint: Secondary | ICD-10-CM | POA: Diagnosis not present

## 2023-04-01 DIAGNOSIS — M25461 Effusion, right knee: Secondary | ICD-10-CM | POA: Diagnosis not present

## 2023-04-01 DIAGNOSIS — Z7409 Other reduced mobility: Secondary | ICD-10-CM | POA: Diagnosis not present

## 2023-04-01 DIAGNOSIS — M25561 Pain in right knee: Secondary | ICD-10-CM | POA: Diagnosis not present

## 2023-04-01 DIAGNOSIS — Z96659 Presence of unspecified artificial knee joint: Secondary | ICD-10-CM | POA: Diagnosis not present

## 2023-04-05 DIAGNOSIS — M25461 Effusion, right knee: Secondary | ICD-10-CM | POA: Diagnosis not present

## 2023-04-05 DIAGNOSIS — Z789 Other specified health status: Secondary | ICD-10-CM | POA: Diagnosis not present

## 2023-04-05 DIAGNOSIS — Z96651 Presence of right artificial knee joint: Secondary | ICD-10-CM | POA: Diagnosis not present

## 2023-04-05 DIAGNOSIS — M25561 Pain in right knee: Secondary | ICD-10-CM | POA: Diagnosis not present

## 2023-04-05 DIAGNOSIS — M25661 Stiffness of right knee, not elsewhere classified: Secondary | ICD-10-CM | POA: Diagnosis not present

## 2023-04-05 DIAGNOSIS — Z7409 Other reduced mobility: Secondary | ICD-10-CM | POA: Diagnosis not present

## 2023-04-08 DIAGNOSIS — M25661 Stiffness of right knee, not elsewhere classified: Secondary | ICD-10-CM | POA: Diagnosis not present

## 2023-04-08 DIAGNOSIS — Z789 Other specified health status: Secondary | ICD-10-CM | POA: Diagnosis not present

## 2023-04-08 DIAGNOSIS — M25461 Effusion, right knee: Secondary | ICD-10-CM | POA: Diagnosis not present

## 2023-04-08 DIAGNOSIS — Z96651 Presence of right artificial knee joint: Secondary | ICD-10-CM | POA: Diagnosis not present

## 2023-04-08 DIAGNOSIS — M25561 Pain in right knee: Secondary | ICD-10-CM | POA: Diagnosis not present

## 2023-04-08 DIAGNOSIS — Z7409 Other reduced mobility: Secondary | ICD-10-CM | POA: Diagnosis not present

## 2023-04-13 DIAGNOSIS — M25561 Pain in right knee: Secondary | ICD-10-CM | POA: Diagnosis not present

## 2023-04-13 DIAGNOSIS — M25461 Effusion, right knee: Secondary | ICD-10-CM | POA: Diagnosis not present

## 2023-04-13 DIAGNOSIS — Z789 Other specified health status: Secondary | ICD-10-CM | POA: Diagnosis not present

## 2023-04-13 DIAGNOSIS — Z7409 Other reduced mobility: Secondary | ICD-10-CM | POA: Diagnosis not present

## 2023-04-13 DIAGNOSIS — M25661 Stiffness of right knee, not elsewhere classified: Secondary | ICD-10-CM | POA: Diagnosis not present

## 2023-04-13 DIAGNOSIS — Z96651 Presence of right artificial knee joint: Secondary | ICD-10-CM | POA: Diagnosis not present

## 2023-04-15 DIAGNOSIS — Z789 Other specified health status: Secondary | ICD-10-CM | POA: Diagnosis not present

## 2023-04-15 DIAGNOSIS — M25461 Effusion, right knee: Secondary | ICD-10-CM | POA: Diagnosis not present

## 2023-04-15 DIAGNOSIS — Z96651 Presence of right artificial knee joint: Secondary | ICD-10-CM | POA: Diagnosis not present

## 2023-04-15 DIAGNOSIS — M25561 Pain in right knee: Secondary | ICD-10-CM | POA: Diagnosis not present

## 2023-04-15 DIAGNOSIS — Z7409 Other reduced mobility: Secondary | ICD-10-CM | POA: Diagnosis not present

## 2023-04-15 DIAGNOSIS — M25661 Stiffness of right knee, not elsewhere classified: Secondary | ICD-10-CM | POA: Diagnosis not present

## 2023-04-19 DIAGNOSIS — Z7409 Other reduced mobility: Secondary | ICD-10-CM | POA: Diagnosis not present

## 2023-04-19 DIAGNOSIS — Z96651 Presence of right artificial knee joint: Secondary | ICD-10-CM | POA: Diagnosis not present

## 2023-04-19 DIAGNOSIS — M25661 Stiffness of right knee, not elsewhere classified: Secondary | ICD-10-CM | POA: Diagnosis not present

## 2023-04-19 DIAGNOSIS — M25561 Pain in right knee: Secondary | ICD-10-CM | POA: Diagnosis not present

## 2023-04-19 DIAGNOSIS — G8929 Other chronic pain: Secondary | ICD-10-CM | POA: Diagnosis not present

## 2023-04-19 DIAGNOSIS — M25461 Effusion, right knee: Secondary | ICD-10-CM | POA: Diagnosis not present

## 2023-04-19 DIAGNOSIS — Z789 Other specified health status: Secondary | ICD-10-CM | POA: Diagnosis not present

## 2023-04-22 DIAGNOSIS — M25561 Pain in right knee: Secondary | ICD-10-CM | POA: Diagnosis not present

## 2023-04-22 DIAGNOSIS — Z789 Other specified health status: Secondary | ICD-10-CM | POA: Diagnosis not present

## 2023-04-22 DIAGNOSIS — Z7409 Other reduced mobility: Secondary | ICD-10-CM | POA: Diagnosis not present

## 2023-04-22 DIAGNOSIS — Z96651 Presence of right artificial knee joint: Secondary | ICD-10-CM | POA: Diagnosis not present

## 2023-04-22 DIAGNOSIS — M25461 Effusion, right knee: Secondary | ICD-10-CM | POA: Diagnosis not present

## 2023-04-22 DIAGNOSIS — M25661 Stiffness of right knee, not elsewhere classified: Secondary | ICD-10-CM | POA: Diagnosis not present

## 2023-04-26 DIAGNOSIS — Z789 Other specified health status: Secondary | ICD-10-CM | POA: Diagnosis not present

## 2023-04-26 DIAGNOSIS — M25561 Pain in right knee: Secondary | ICD-10-CM | POA: Diagnosis not present

## 2023-04-26 DIAGNOSIS — M25461 Effusion, right knee: Secondary | ICD-10-CM | POA: Diagnosis not present

## 2023-04-26 DIAGNOSIS — Z7409 Other reduced mobility: Secondary | ICD-10-CM | POA: Diagnosis not present

## 2023-04-26 DIAGNOSIS — M25661 Stiffness of right knee, not elsewhere classified: Secondary | ICD-10-CM | POA: Diagnosis not present

## 2023-04-26 DIAGNOSIS — Z96651 Presence of right artificial knee joint: Secondary | ICD-10-CM | POA: Diagnosis not present

## 2023-07-19 ENCOUNTER — Telehealth: Payer: Self-pay

## 2023-07-19 DIAGNOSIS — Z79899 Other long term (current) drug therapy: Secondary | ICD-10-CM

## 2023-07-19 NOTE — Progress Notes (Signed)
   07/19/2023  Patient ID: Jose Tyler, male   DOB: 12-Aug-1955, 68 y.o.   MRN: [id number]  Patient is on med fails report for 2024: MAC   - unable to get a full medication report in Dr. Tiajuana Amass (only oxycodone is pulls up).   - still awaiting EMR access for additional information  Cephus Shelling, PharmD Clinical Pharmacist Cell: [phone]

## 2023-08-02 NOTE — Progress Notes (Signed)
   08/02/2023  Patient ID: Jose Tyler, male   DOB: [birth year], 68 y.o.   MRN: [id number]  Medication Adherence 2024 Fails Report MAC:   Rosuvastatin 10 mg daily renewed on 06/30/2023 per LOV notes.  I called [facility]. LF on 07/01/2023 x 90 D/s and 1 RF remaining and a prescription on file for 3 RF. $0 copay per [facility].  Will f/u prior to next fill to make sure patient refills medication.   Cephus Shelling, PharmD Clinical Pharmacist Cell: [phone]

## 2024-05-13 IMAGING — US US ABDOMINAL AORTA SCREENING AAA
1 series · 14 of 14 positions shown · non-contrast
Comparison: None Available.

CLINICAL DATA: Cardiovascular disorder screening.

EXAM:
US ABDOMINAL AORTA MEDICARE SCREENING
TECHNIQUE: Ultrasound examination of the abdominal aorta was performed as a
screening evaluation for abdominal aortic aneurysm.

[Series 1: us abdominal aorta screening aaa · 0.23mm/px · 14 of 14 slices shown]
[im 1/14]
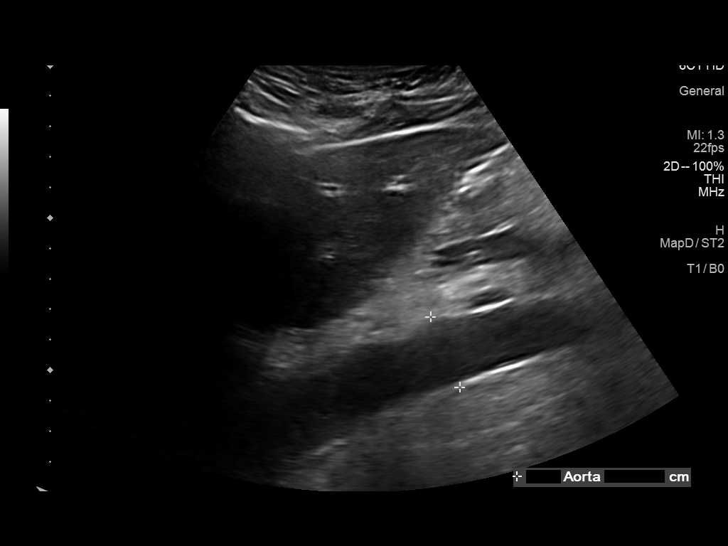
[im 2/14]
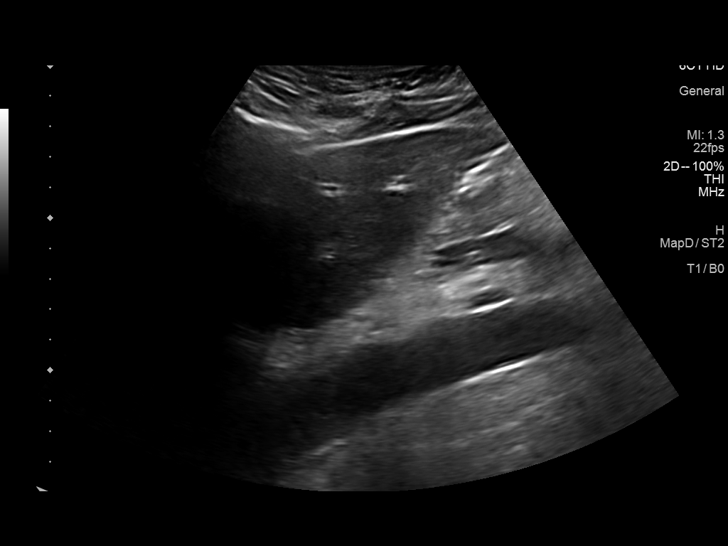
[im 3/14]
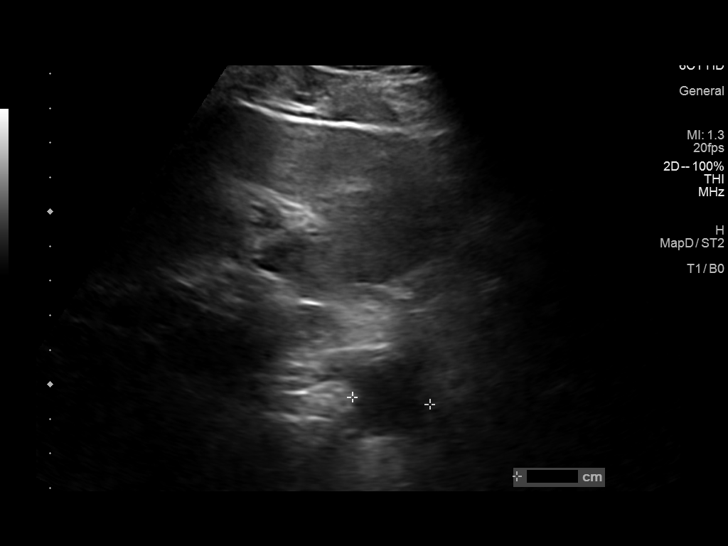
[im 4/14]
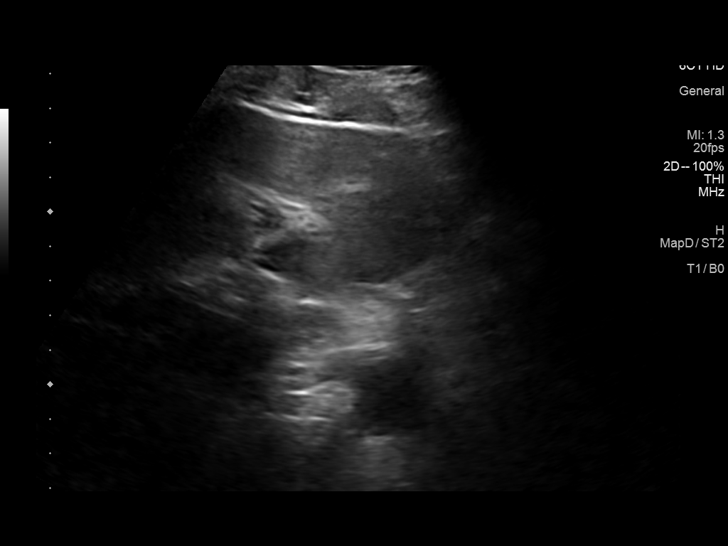
[im 5/14]
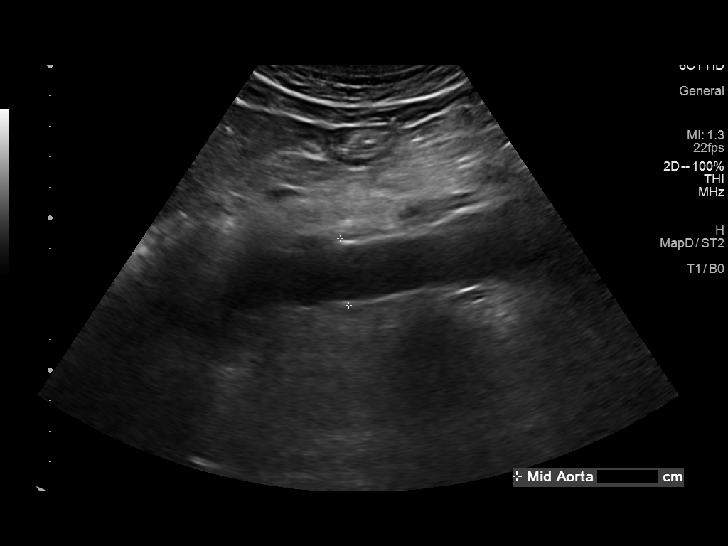
[im 6/14]
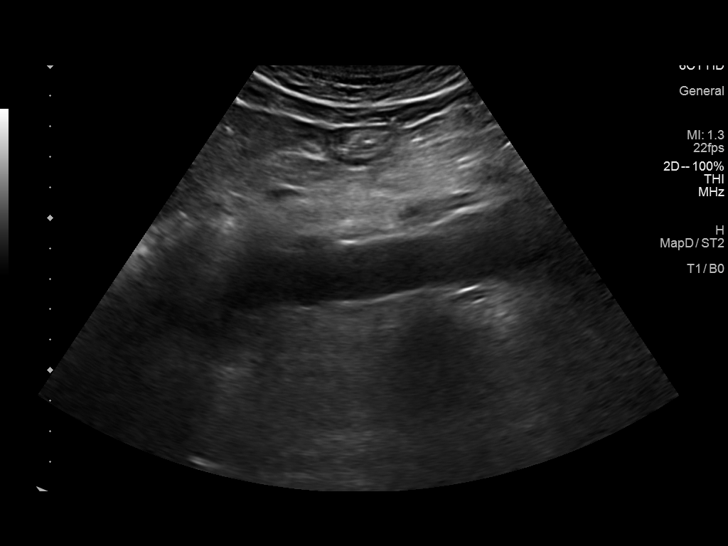
[im 7/14]
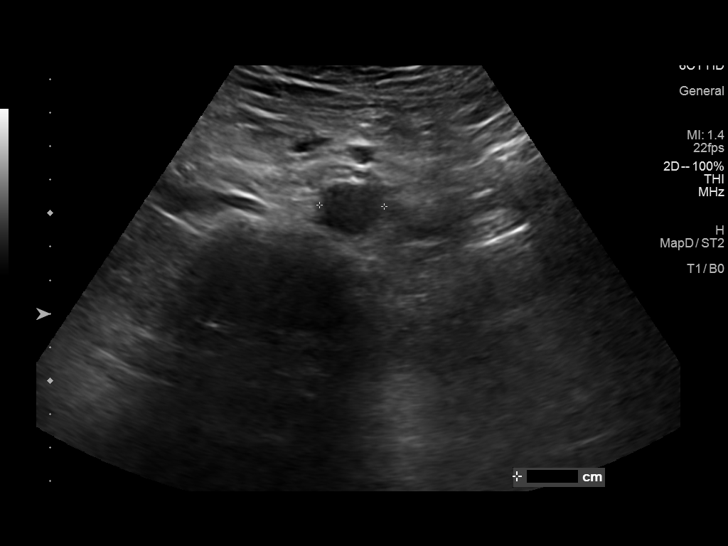
[im 8/14]
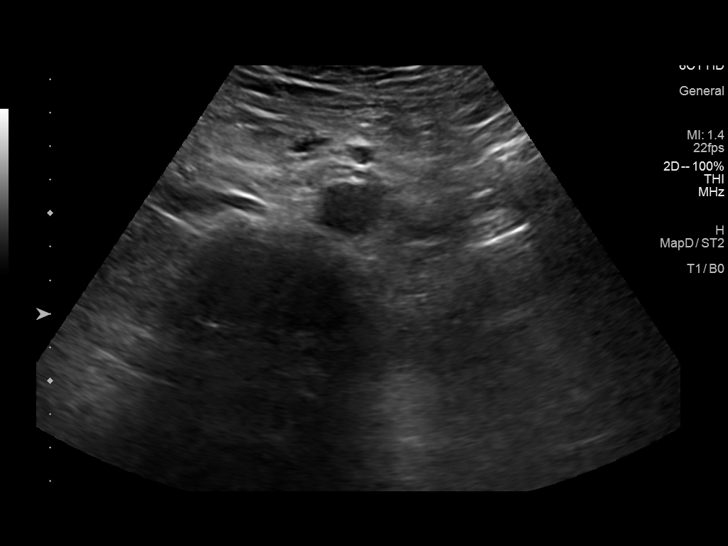
[im 9/14]
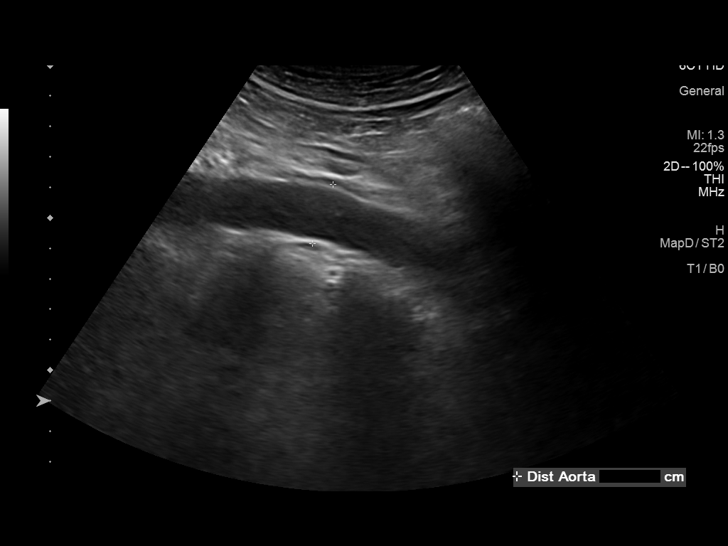
[im 10/14]
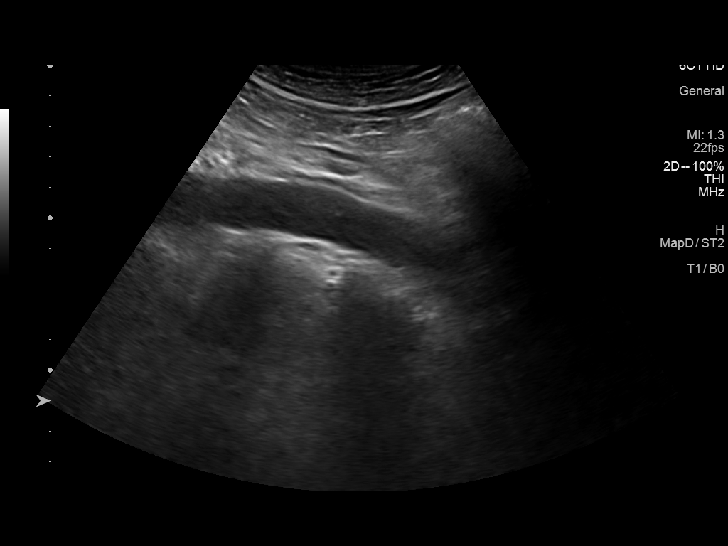
[im 11/14]
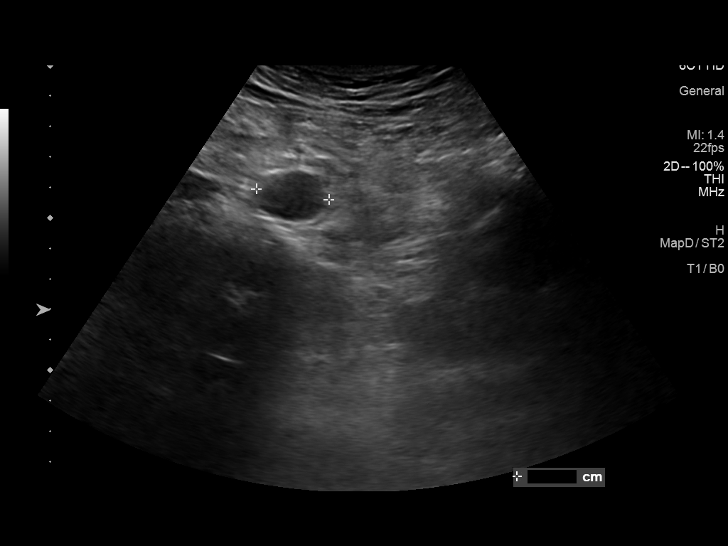
[im 12/14]
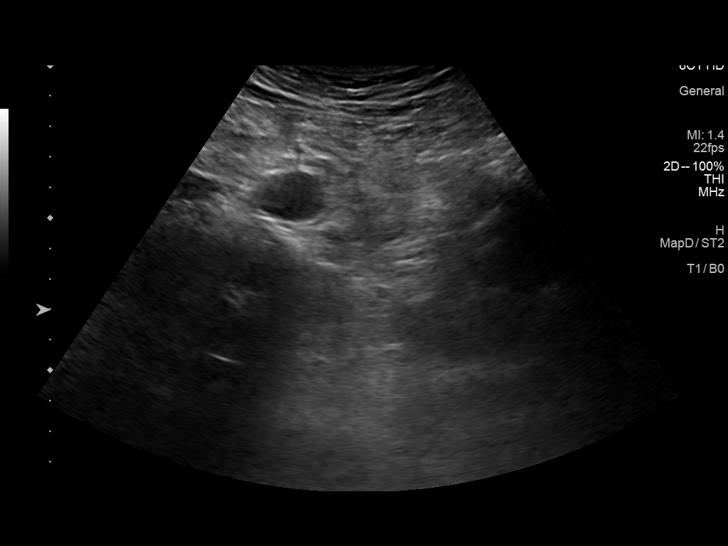
[im 13/14]
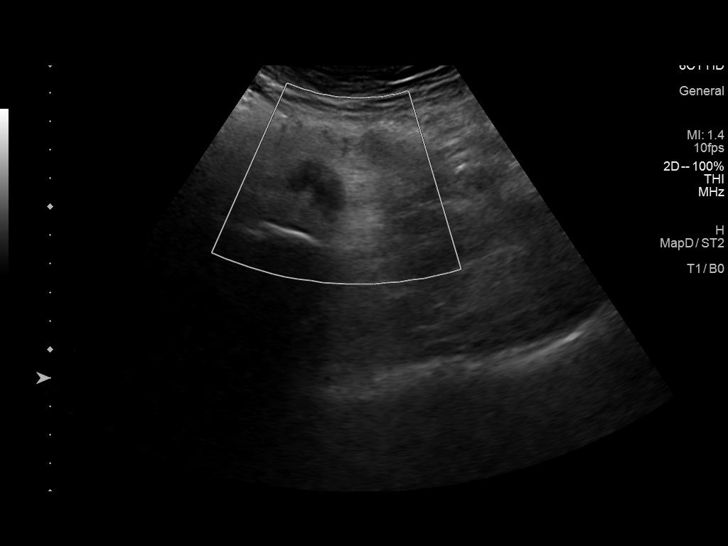
[im 14/14]
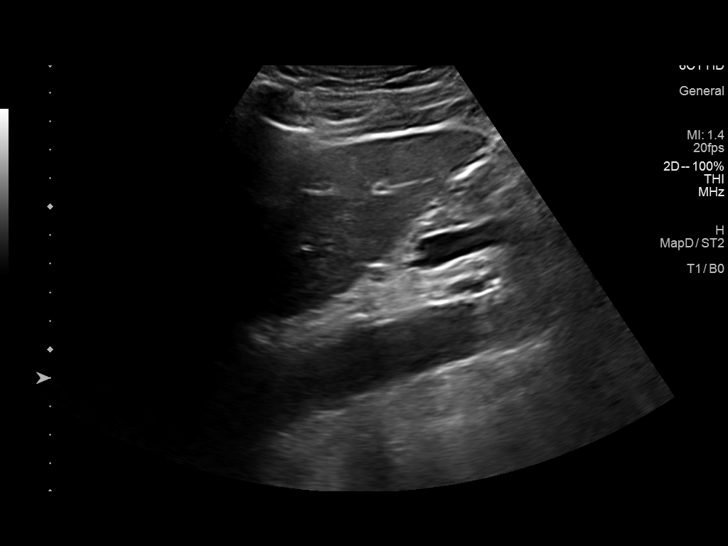

[14 of 14 positions shown; findings below may reference images not displayed]

FINDINGS: Abdominal aortic measurements as follows:

Proximal:  2.5 x 2.3 cm

Mid:  2.2 x 1.9 cm

Distal:  2.1 x 2.4 cm
IMPRESSION: No evidence for abdominal aortic aneurysm.
